# Patient Record
Sex: Male | Born: 1975 | Race: Black or African American | Hispanic: No | Marital: Married | Smoking: Former smoker
Health system: Southern US, Community
[De-identification: ages and names within clinical notes are randomized; demographics above are authoritative.]

## PROBLEM LIST (undated history)

## (undated) DIAGNOSIS — R569 Unspecified convulsions: Secondary | ICD-10-CM

## (undated) DIAGNOSIS — R079 Chest pain, unspecified: Secondary | ICD-10-CM

## (undated) DIAGNOSIS — I10 Essential (primary) hypertension: Secondary | ICD-10-CM

## (undated) DIAGNOSIS — R42 Dizziness and giddiness: Secondary | ICD-10-CM

## (undated) DIAGNOSIS — H02401 Unspecified ptosis of right eyelid: Secondary | ICD-10-CM

## (undated) DIAGNOSIS — G4700 Insomnia, unspecified: Secondary | ICD-10-CM

## (undated) HISTORY — DX: Chest pain, unspecified: R07.9

## (undated) HISTORY — DX: Insomnia, unspecified: G47.00

## (undated) HISTORY — PX: FOOT SURGERY: SHX648

## (undated) HISTORY — DX: Unspecified convulsions: R56.9

## (undated) HISTORY — DX: Unspecified ptosis of right eyelid: H02.401

## (undated) HISTORY — DX: Dizziness and giddiness: R42

---

## 2007-05-12 ENCOUNTER — Emergency Department: Payer: Self-pay | Admitting: Emergency Medicine

## 2012-10-09 ENCOUNTER — Ambulatory Visit: Payer: Self-pay | Admitting: Internal Medicine

## 2015-06-10 ENCOUNTER — Other Ambulatory Visit
Admission: RE | Admit: 2015-06-10 | Discharge: 2015-06-10 | Disposition: A | Discharge status: Home / Self Care | Payer: Managed Care, Other (non HMO) | Source: Ambulatory Visit | Attending: Nurse Practitioner | Admitting: Nurse Practitioner

## 2015-06-10 DIAGNOSIS — F524 Premature ejaculation: Secondary | ICD-10-CM | POA: Diagnosis present

## 2015-06-10 DIAGNOSIS — E782 Mixed hyperlipidemia: Secondary | ICD-10-CM | POA: Insufficient documentation

## 2015-06-10 DIAGNOSIS — Z0001 Encounter for general adult medical examination with abnormal findings: Secondary | ICD-10-CM | POA: Diagnosis present

## 2015-06-10 LAB — LIPID PANEL
CHOL/HDL RATIO: 2.8 ratio
CHOLESTEROL: 110 mg/dL (ref 0–200)
HDL: 39 mg/dL — AB (ref >40)
LDL CALC: 60 mg/dL (ref 0–99)
TRIGLYCERIDES: 56 mg/dL (ref <150)
VLDL: 11 mg/dL (ref 0–40)

## 2015-06-10 LAB — COMPREHENSIVE METABOLIC PANEL
ALT: 77 U/L — AB (ref 17–63)
ANION GAP: 3 — AB (ref 5–15)
AST: 101 U/L — ABNORMAL HIGH (ref 15–41)
Albumin: 4.1 g/dL (ref 3.5–5.0)
Alkaline Phosphatase: 47 U/L (ref 38–126)
BUN: 15 mg/dL (ref 6–20)
CHLORIDE: 106 mmol/L (ref 101–111)
CO2: 27 mmol/L (ref 22–32)
Calcium: 8.7 mg/dL — ABNORMAL LOW (ref 8.9–10.3)
Creatinine, Ser: 0.87 mg/dL (ref 0.61–1.24)
GFR calc non Af Amer: >60 mL/min (ref >60)
Glucose, Bld: 94 mg/dL (ref 65–99)
Potassium: 4.2 mmol/L (ref 3.5–5.1)
SODIUM: 136 mmol/L (ref 135–145)
Total Bilirubin: 0.6 mg/dL (ref 0.3–1.2)
Total Protein: 7 g/dL (ref 6.5–8.1)

## 2015-06-10 LAB — CBC WITH DIFFERENTIAL/PLATELET
BASOS ABS: 0 10*3/uL (ref 0–0.1)
BASOS PCT: 0 %
EOS PCT: 1 %
Eosinophils Absolute: 0.1 10*3/uL (ref 0–0.7)
HCT: 45 % (ref 40.0–52.0)
Hemoglobin: 15.1 g/dL (ref 13.0–18.0)
Lymphocytes Relative: 25 %
Lymphs Abs: 1.3 10*3/uL (ref 1.0–3.6)
MCH: 31.4 pg (ref 26.0–34.0)
MCHC: 33.6 g/dL (ref 32.0–36.0)
MCV: 93.6 fL (ref 80.0–100.0)
MONO ABS: 0.3 10*3/uL (ref 0.2–1.0)
MONOS PCT: 7 %
Neutro Abs: 3.5 10*3/uL (ref 1.4–6.5)
Neutrophils Relative %: 67 %
PLATELETS: 175 10*3/uL (ref 150–440)
RBC: 4.81 MIL/uL (ref 4.40–5.90)
RDW: 13.2 % (ref 11.5–14.5)
WBC: 5.2 10*3/uL (ref 3.8–10.6)

## 2015-06-10 LAB — T4, FREE: Free T4: 0.61 ng/dL (ref 0.61–1.12)

## 2015-06-10 LAB — TSH: TSH: 0.5 u[IU]/mL (ref 0.350–4.500)

## 2015-06-11 LAB — TESTOSTERONE: Testosterone: 563 ng/dL (ref 348–1197)

## 2016-01-31 ENCOUNTER — Encounter: Payer: Self-pay | Admitting: Emergency Medicine

## 2016-01-31 ENCOUNTER — Emergency Department
Admission: EM | Admit: 2016-01-31 | Discharge: 2016-01-31 | Disposition: A | Discharge status: Home / Self Care | Payer: Managed Care, Other (non HMO) | Attending: Student in an Organized Health Care Education/Training Program | Admitting: Student in an Organized Health Care Education/Training Program

## 2016-01-31 DIAGNOSIS — X503XXA Overexertion from repetitive movements, initial encounter: Secondary | ICD-10-CM | POA: Insufficient documentation

## 2016-01-31 DIAGNOSIS — S46011A Strain of muscle(s) and tendon(s) of the rotator cuff of right shoulder, initial encounter: Secondary | ICD-10-CM | POA: Insufficient documentation

## 2016-01-31 DIAGNOSIS — Y929 Unspecified place or not applicable: Secondary | ICD-10-CM | POA: Insufficient documentation

## 2016-01-31 DIAGNOSIS — Y9389 Activity, other specified: Secondary | ICD-10-CM | POA: Insufficient documentation

## 2016-01-31 DIAGNOSIS — F17290 Nicotine dependence, other tobacco product, uncomplicated: Secondary | ICD-10-CM | POA: Insufficient documentation

## 2016-01-31 DIAGNOSIS — Y990 Civilian activity done for income or pay: Secondary | ICD-10-CM | POA: Insufficient documentation

## 2016-01-31 MED ORDER — CYCLOBENZAPRINE HCL 10 MG PO TABS: 10.0000 mg | ORAL_TABLET | Freq: Three times a day (TID) | ORAL | 0 refills | Status: DC | PRN

## 2016-01-31 MED ORDER — MELOXICAM 15 MG PO TABS: 15.0000 mg | ORAL_TABLET | Freq: Every day | ORAL | 0 refills | Status: DC

## 2016-01-31 NOTE — ED Triage Notes (Signed)
R shoulder pain x 1 month, denies injury at onset.

## 2016-01-31 NOTE — ED Notes (Signed)
Pt verbalized understanding of discharge instructions. NAD at this time. 

## 2016-01-31 NOTE — ED Provider Notes (Signed)
[facility] Emergency Department Provider Note ____________________________________________  Time seen: Approximately 5:05 PM  I have reviewed the triage vital signs and the nursing notes.   HISTORY  Chief Complaint Shoulder Pain    HPI Tony Collier is a 40 y.o. male who presents to the emergency department for evaluation of right shoulder pain. Pain is present for the past month. He denies injury or history of shoulder pain. He reports a job where he makes repetitive motion with his right shoulder and feels this is the source of pain. He has intermittently taken ibuprofen and Aleve without relief.   History reviewed. No pertinent past medical history.  There are no active problems to display for this patient.   History reviewed. No pertinent surgical history.  Prior to Admission medications   Medication Sig Start Date End Date Taking? Authorizing Provider  cyclobenzaprine (FLEXERIL) 10 MG tablet Take 1 tablet (10 mg total) by mouth 3 (three) times daily as needed for muscle spasms. 01/31/16   Victorino Dike, FNP  meloxicam (MOBIC) 15 MG tablet Take 1 tablet (15 mg total) by mouth daily. 01/31/16   Victorino Dike, FNP    Allergies Patient has no known allergies.  No family history on file.  Social History Social History  Substance Use Topics  . Smoking status: Current Every Day Smoker  . Smokeless tobacco: Current User  . Alcohol use Not on file    Review of Systems Constitutional: No recent illness. Cardiovascular: Denies chest pain or palpitations. Respiratory: Denies shortness of breath. Musculoskeletal: Pain in Right shoulder Skin: Negative for rash, wound, lesion. Neurological: Negative for focal weakness or numbness.  ____________________________________________   PHYSICAL EXAM:  VITAL SIGNS: ED Triage Vitals  Enc Vitals Group     BP 01/31/16 1627 119/66     Pulse Rate 01/31/16 1627 60     Resp 01/31/16 1627 18     Temp  01/31/16 1627 98.1 F (36.7 C)     Temp Source 01/31/16 1627 Oral     SpO2 01/31/16 1627 98 %     Weight 01/31/16 1628 190 lb (86.2 kg)     Height 01/31/16 1628 5\' 6"  (1.676 m)     Head Circumference --      Peak Flow --      Pain Score 01/31/16 1629 8     Pain Loc --      Pain Edu? --      Excl. in [place]? --     Constitutional: Alert and oriented. Well appearing and in no acute distress. Eyes: Conjunctivae are normal. EOMI. Head: Atraumatic. Neck: No stridor.  Respiratory: Normal respiratory effort.   Musculoskeletal: Limited abduction and external rotation of the right shoulder secondary pain. Tenderness to palpation along the anterior and posterior joint lines. No step-off deformity or obvious AC separation. No tenderness along the clavicle. Neurologic:  Normal speech and language. No gross focal neurologic deficits are appreciated. Speech is normal. No gait instability. Skin:  Skin is warm, dry and intact. Atraumatic. Psychiatric: Mood and affect are normal. Speech and behavior are normal.  ____________________________________________   LABS (all labs ordered are listed, but only abnormal results are displayed)  Labs Reviewed - No data to display ____________________________________________  RADIOLOGY  Not indicated ____________________________________________   PROCEDURES  Procedure(s) performed: None   ____________________________________________   INITIAL IMPRESSION / ASSESSMENT AND PLAN / ED COURSE  Clinical Course     Pertinent labs & imaging results that were available during my  care of the patient were reviewed by me and considered in my medical decision making (see chart for details).  Patient was prescribed Flexeril and Naprosyn. He is to rest and ice the shoulder over the weekend. He was encouraged to follow up with orthopedics for symptoms that are not improving over the week. He was advised to return to emergency department for symptoms change or  worsening symptoms schedule an appointment. ____________________________________________   FINAL CLINICAL IMPRESSION(S) / ED DIAGNOSES  Final diagnoses:  Rotator cuff strain, right, initial encounter       Victorino Dike, FNP 01/31/16 1746    Merlyn Lot, MD 01/31/16 [phone]

## 2017-06-30 ENCOUNTER — Encounter: Payer: Self-pay | Admitting: Nurse Practitioner

## 2017-11-10 DIAGNOSIS — M779 Enthesopathy, unspecified: Secondary | ICD-10-CM | POA: Diagnosis not present

## 2017-11-10 DIAGNOSIS — M2021 Hallux rigidus, right foot: Secondary | ICD-10-CM | POA: Diagnosis not present

## 2017-11-10 DIAGNOSIS — M2022 Hallux rigidus, left foot: Secondary | ICD-10-CM | POA: Diagnosis not present

## 2017-11-10 DIAGNOSIS — M898X9 Other specified disorders of bone, unspecified site: Secondary | ICD-10-CM | POA: Diagnosis not present

## 2017-11-11 ENCOUNTER — Ambulatory Visit: Payer: BLUE CROSS/BLUE SHIELD | Admitting: Adult Health

## 2017-11-11 ENCOUNTER — Encounter: Payer: Self-pay | Admitting: Adult Health

## 2017-11-11 VITALS — BP 112/68 | HR 65 | Resp 16 | Ht 66.0 in | Wt 164.4 lb

## 2017-11-11 DIAGNOSIS — G40909 Epilepsy, unspecified, not intractable, without status epilepticus: Secondary | ICD-10-CM

## 2017-11-11 DIAGNOSIS — F17210 Nicotine dependence, cigarettes, uncomplicated: Secondary | ICD-10-CM | POA: Diagnosis not present

## 2017-11-11 DIAGNOSIS — G4700 Insomnia, unspecified: Secondary | ICD-10-CM

## 2017-11-11 DIAGNOSIS — Z720 Tobacco use: Secondary | ICD-10-CM | POA: Diagnosis not present

## 2017-11-11 MED ORDER — CARBAMAZEPINE 100 MG PO CHEW: 100.0000 mg | CHEWABLE_TABLET | ORAL | 3 refills | Status: DC

## 2017-11-11 MED ORDER — BUPROPION HCL ER (SR) 150 MG PO TB12
150.0000 mg | ORAL_TABLET | Freq: Two times a day (BID) | ORAL | 1 refills | Status: DC
Start: 2017-11-11 — End: 2017-12-22

## 2017-11-11 MED ORDER — NICOTINE 21 MG/24HR TD PT24: 21.0000 mg | MEDICATED_PATCH | Freq: Every day | TRANSDERMAL | 0 refills | Status: DC

## 2017-11-11 NOTE — Patient Instructions (Signed)
Coping with Quitting Smoking Quitting smoking is a physical and mental challenge. You will face cravings, withdrawal symptoms, and temptation. Before quitting, work with your health care provider to make a plan that can help you cope. Preparation can help you quit and keep you from giving in. How can I cope with cravings? Cravings usually last for 5-10 minutes. If you get through it, the craving will pass. Consider taking the following actions to help you cope with cravings:  Keep your mouth busy: ? Chew sugar-free gum. ? Suck on hard candies or a straw. ? Brush your teeth.  Keep your hands and body busy: ? Immediately change to a different activity when you feel a craving. ? Squeeze or play with a ball. ? Do an activity or a hobby, like making bead jewelry, practicing needlepoint, or working with wood. ? Mix up your normal routine. ? Take a short exercise break. Go for a quick walk or run up and down stairs. ? Spend time in public places where smoking is not allowed.  Focus on doing something kind or helpful for someone else.  Call a friend or family member to talk during a craving.  Join a support group.  Call a quit line, such as 1-800-QUIT-NOW.  Talk with your health care provider about medicines that might help you cope with cravings and make quitting easier for you.  How can I deal with withdrawal symptoms? Your body may experience negative effects as it tries to get used to not having nicotine in the system. These effects are called withdrawal symptoms. They may include:  Feeling hungrier than normal.  Trouble concentrating.  Irritability.  Trouble sleeping.  Feeling depressed.  Restlessness and agitation.  Craving a cigarette.  To manage withdrawal symptoms:  Avoid places, people, and activities that trigger your cravings.  Remember why you want to quit.  Get plenty of sleep.  Avoid coffee and other caffeinated drinks. These may worsen some of your  symptoms.  How can I handle social situations? Social situations can be difficult when you are quitting smoking, especially in the first few weeks. To manage this, you can:  Avoid parties, bars, and other social situations where people might be smoking.  Avoid alcohol.  Leave right away if you have the urge to smoke.  Explain to your family and friends that you are quitting smoking. Ask for understanding and support.  Plan activities with friends or family where smoking is not an option.  What are some ways I can cope with stress? Wanting to smoke may cause stress, and stress can make you want to smoke. Find ways to manage your stress. Relaxation techniques can help. For example:  Breathe slowly and deeply, in through your nose and out through your mouth.  Listen to soothing, relaxing music.  Talk with a family member or friend about your stress.  Light a candle.  Soak in a bath or take a shower.  Think about a peaceful place.  What are some ways I can prevent weight gain? Be aware that many people gain weight after they quit smoking. However, not everyone does. To keep from gaining weight, have a plan in place before you quit and stick to the plan after you quit. Your plan should include:  Having healthy snacks. When you have a craving, it may help to: ? Eat plain popcorn, crunchy carrots, celery, or other cut vegetables. ? Chew sugar-free gum.  Changing how you eat: ? Eat small portion sizes at meals. ?   Eat 4-6 small meals throughout the day instead of 1-2 large meals a day. ? Be mindful when you eat. Do not watch television or do other things that might distract you as you eat.  Exercising regularly: ? Make time to exercise each day. If you do not have time for a long workout, do short bouts of exercise for 5-10 minutes several times a day. ? Do some form of strengthening exercise, like weight lifting, and some form of aerobic exercise, like running or  swimming.  Drinking plenty of water or other low-calorie or no-calorie drinks. Drink 6-8 glasses of water daily, or as much as instructed by your health care provider.  Summary  Quitting smoking is a physical and mental challenge. You will face cravings, withdrawal symptoms, and temptation to smoke again. Preparation can help you as you go through these challenges.  You can cope with cravings by keeping your mouth busy (such as by chewing gum), keeping your body and hands busy, and making calls to family, friends, or a helpline for people who want to quit smoking.  You can cope with withdrawal symptoms by avoiding places where people smoke, avoiding drinks with caffeine, and getting plenty of rest.  Ask your health care provider about the different ways to prevent weight gain, avoid stress, and handle social situations. This information is not intended to replace advice given to you by your health care provider. Make sure you discuss any questions you have with your health care provider. Document Released: 02/27/2016 Document Revised: 02/27/2016 Document Reviewed: 02/27/2016 Elsevier Interactive Patient Education  2018 [employer]  

## 2017-11-11 NOTE — Progress Notes (Signed)
[facility] [place], [place] [address]  Internal MEDICINE  Office Visit Note  Patient Name: Tony Collier  [id number]  [id number]  Date of Service: 11/27/2017  Chief Complaint  Patient presents with  . Seizures  . Insomnia    HPI Pt here for follow up on seizures and insomnia. He is feeling well, denies complaints at this time.  He was a previous patient in this practice before. He is here to reestablish care. He would like to quit smoking, and is requesting Wellbutrin and nicotine patches.  He used this treatment to quit smoking before, and was successful for over a year before relapsing.          Current Medication: Outpatient Encounter Medications as of 11/11/2017  Medication Sig  . carbamazepine (TEGRETOL) 100 MG chewable tablet Chew 1 tablet (100 mg total) by mouth See admin instructions. Take 2 Tablets in AM, and 3 Tablets in PM.  . [DISCONTINUED] carbamazepine (TEGRETOL) 100 MG chewable tablet 2 TABS IN THE AM AND 3 TABS IN THE PM  . buPROPion (WELLBUTRIN SR) 150 MG 12 hr tablet Take 1 tablet (150 mg total) by mouth 2 (two) times daily.  Derrill Memo ON 12/11/2017] nicotine (NICODERM CQ - DOSED IN MG/24 HOURS) 21 mg/24hr patch Place 1 patch (21 mg total) onto the skin daily.  . [DISCONTINUED] cyclobenzaprine (FLEXERIL) 10 MG tablet Take 1 tablet (10 mg total) by mouth 3 (three) times daily as needed for muscle spasms. (Patient not taking: Reported on 11/11/2017)  . [DISCONTINUED] meloxicam (MOBIC) 15 MG tablet Take 1 tablet (15 mg total) by mouth daily. (Patient not taking: Reported on 11/11/2017)   No facility-administered encounter medications on file as of 11/11/2017.     Surgical History: History reviewed. No pertinent surgical history.  Medical History: Past Medical History:  Diagnosis Date  . Insomnia   . Seizures ([place])     Family History: Family History  Family history unknown: Yes    Social History   Socioeconomic History  . Marital  status: Married    Spouse name: Not on file  . Number of children: Not on file  . Years of education: Not on file  . Highest education level: Not on file  Occupational History  . Not on file  Social Needs  . Financial resource strain: Not on file  . Food insecurity:    Worry: Not on file    Inability: Not on file  . Transportation needs:    Medical: Not on file    Non-medical: Not on file  Tobacco Use  . Smoking status: Current Every Day Smoker  . Smokeless tobacco: Former Network engineer and Sexual Activity  . Alcohol use: Yes    Frequency: Never  . Drug use: Never  . Sexual activity: Not on file  Lifestyle  . Physical activity:    Days per week: Not on file    Minutes per session: Not on file  . Stress: Not on file  Relationships  . Social connections:    Talks on phone: Not on file    Gets together: Not on file    Attends religious service: Not on file    Active member of club or organization: Not on file    Attends meetings of clubs or organizations: Not on file    Relationship status: Not on file  . Intimate partner violence:    Fear of current or ex partner: Not on file    Emotionally abused:  Not on file    Physically abused: Not on file    Forced sexual activity: Not on file  Other Topics Concern  . Not on file  Social History Narrative  . Not on file   Review of Systems  Constitutional: Negative.  Negative for chills, fatigue and unexpected weight change.  HENT: Negative.  Negative for congestion, rhinorrhea, sneezing and sore throat.   Eyes: Negative for redness.  Respiratory: Negative.  Negative for cough, chest tightness and shortness of breath.   Cardiovascular: Negative.  Negative for chest pain and palpitations.  Gastrointestinal: Negative.  Negative for abdominal pain, constipation, diarrhea, nausea and vomiting.  Endocrine: Negative.   Genitourinary: Negative.  Negative for dysuria and frequency.  Musculoskeletal: Negative.  Negative for  arthralgias, back pain, joint swelling and neck pain.  Skin: Negative.  Negative for rash.  Allergic/Immunologic: Negative.   Neurological: Negative.  Negative for tremors and numbness.  Hematological: Negative for adenopathy. Does not bruise/bleed easily.  Psychiatric/Behavioral: Negative.  Negative for behavioral problems, sleep disturbance and suicidal ideas. The patient is not nervous/anxious.    Vital Signs: BP 112/68   Pulse 65   Resp 16   Ht 5\' 6"  (1.676 m)   Wt 164 lb 6.4 oz (74.6 kg)   SpO2 97%   BMI 26.53 kg/m   Physical Exam  Constitutional: He is oriented to person, place, and time. He appears well-developed and well-nourished. No distress.  HENT:  Head: Normocephalic and atraumatic.  Mouth/Throat: Oropharynx is clear and moist. No oropharyngeal exudate.  Eyes: Pupils are equal, round, and reactive to light. EOM are normal.  Neck: Normal range of motion. Neck supple. No JVD present. No tracheal deviation present. No thyromegaly present.  Cardiovascular: Normal rate, regular rhythm and normal heart sounds. Exam reveals no gallop and no friction rub.  No murmur heard. Pulmonary/Chest: Effort normal and breath sounds normal. No respiratory distress. He has no wheezes. He has no rales. He exhibits no tenderness.  Abdominal: Soft. There is no tenderness. There is no guarding.  Musculoskeletal: Normal range of motion.  Lymphadenopathy:    He has no cervical adenopathy.  Neurological: He is alert and oriented to person, place, and time. No cranial nerve deficit.  Skin: Skin is warm and dry. He is not diaphoretic.  Psychiatric: He has a normal mood and affect. His behavior is normal. Judgment and thought content normal.  Nursing note and vitals reviewed.  Assessment/Plan: 1. Seizure disorder ([place]) Continue taking medications as directed.  - carbamazepine (TEGRETOL) 100 MG chewable tablet; Chew 1 tablet (100 mg total) by mouth See admin instructions. Take 2 Tablets in AM,  and 3 Tablets in PM.  Dispense: 150 tablet; Refill: 3  2. Tobacco abuse Use Nicotine patches, and Wellbutrin as ordered and discussed for smoking cessation.  - buPROPion (WELLBUTRIN SR) 150 MG 12 hr tablet; Take 1 tablet (150 mg total) by mouth 2 (two) times daily.  Dispense: 30 tablet; Refill: 1 - nicotine (NICODERM CQ - DOSED IN MG/24 HOURS) 21 mg/24hr patch; Place 1 patch (21 mg total) onto the skin daily.  Dispense: 28 patch; Refill: 0  3. Insomnia, unspecified type Pt is doing well with this, He has been sleeping at least 7 hours nightly.   General Counseling: Casimiro Needle understanding of the findings of todays visit and agrees with plan of treatment. I have discussed any further diagnostic evaluation that may be needed or ordered today. We also reviewed his medications today. he has been encouraged to call  the office with any questions or concerns that should arise related to todays visit.  Meds ordered this encounter  Medications  . carbamazepine (TEGRETOL) 100 MG chewable tablet    Sig: Chew 1 tablet (100 mg total) by mouth See admin instructions. Take 2 Tablets in AM, and 3 Tablets in PM.    Dispense:  150 tablet    Refill:  3  . buPROPion (WELLBUTRIN SR) 150 MG 12 hr tablet    Sig: Take 1 tablet (150 mg total) by mouth 2 (two) times daily.    Dispense:  30 tablet    Refill:  1  . nicotine (NICODERM CQ - DOSED IN MG/24 HOURS) 21 mg/24hr patch    Sig: Place 1 patch (21 mg total) onto the skin daily.    Dispense:  28 patch    Refill:  0    Time spent: 25 Minutes  This patient was seen by Orson Gear AGNP-C in Collaboration with Dr Lavera Guise as a part of collaborative care agreement    Dr Lavera Guise Internal medicine

## 2017-11-23 ENCOUNTER — Other Ambulatory Visit: Payer: Self-pay | Admitting: Adult Health

## 2017-11-23 ENCOUNTER — Telehealth: Payer: Self-pay | Admitting: Adult Health

## 2017-11-23 MED ORDER — NICOTINE 21 MG/24HR TD PT24: 21.0000 mg | MEDICATED_PATCH | Freq: Every day | TRANSDERMAL | 0 refills | Status: DC

## 2017-11-24 ENCOUNTER — Other Ambulatory Visit: Payer: Self-pay | Admitting: Nurse Practitioner

## 2017-11-24 DIAGNOSIS — Z720 Tobacco use: Secondary | ICD-10-CM

## 2017-11-24 MED ORDER — VARENICLINE TARTRATE 1 MG PO TABS: ORAL_TABLET | ORAL | 3 refills | Status: DC

## 2017-11-24 MED ORDER — VARENICLINE TARTRATE 0.5 MG X 11 & 1 MG X 42 PO MISC: ORAL | 0 refills | Status: DC

## 2017-11-24 NOTE — Telephone Encounter (Signed)
Called and notified patient's wife.  

## 2017-11-24 NOTE — Progress Notes (Signed)
Sent in prescriptions for chantix tarting month and continuing month packs. Finish starting month before starting with continuing month. Prescription for continuing month has 3 additional refills. Both sent to [facility]

## 2017-12-09 ENCOUNTER — Ambulatory Visit: Payer: Self-pay | Admitting: Adult Health

## 2017-12-15 ENCOUNTER — Ambulatory Visit: Payer: Self-pay | Admitting: Adult Health

## 2017-12-22 ENCOUNTER — Encounter: Payer: Self-pay | Admitting: Adult Health

## 2017-12-22 ENCOUNTER — Ambulatory Visit: Payer: BLUE CROSS/BLUE SHIELD | Admitting: Adult Health

## 2017-12-22 VITALS — BP 114/78 | HR 63 | Resp 16 | Ht 66.0 in | Wt 169.2 lb

## 2017-12-22 DIAGNOSIS — Z720 Tobacco use: Secondary | ICD-10-CM

## 2017-12-22 DIAGNOSIS — G40909 Epilepsy, unspecified, not intractable, without status epilepticus: Secondary | ICD-10-CM

## 2017-12-22 MED ORDER — CARBAMAZEPINE 100 MG PO CHEW: 100.0000 mg | CHEWABLE_TABLET | ORAL | 3 refills | Status: DC

## 2017-12-22 MED ORDER — BUPROPION HCL ER (SR) 150 MG PO TB12: 150.0000 mg | ORAL_TABLET | Freq: Two times a day (BID) | ORAL | 1 refills | Status: DC

## 2017-12-22 MED ORDER — VARENICLINE TARTRATE 1 MG PO TABS: 1.0000 mg | ORAL_TABLET | Freq: Two times a day (BID) | ORAL | 0 refills | Status: DC

## 2017-12-22 NOTE — Patient Instructions (Signed)
Coping with Quitting Smoking Quitting smoking is a physical and mental challenge. You will face cravings, withdrawal symptoms, and temptation. Before quitting, work with your health care provider to make a plan that can help you cope. Preparation can help you quit and keep you from giving in. How can I cope with cravings? Cravings usually last for 5-10 minutes. If you get through it, the craving will pass. Consider taking the following actions to help you cope with cravings:  Keep your mouth busy: ? Chew sugar-free gum. ? Suck on hard candies or a straw. ? Brush your teeth.  Keep your hands and body busy: ? Immediately change to a different activity when you feel a craving. ? Squeeze or play with a ball. ? Do an activity or a hobby, like making bead jewelry, practicing needlepoint, or working with wood. ? Mix up your normal routine. ? Take a short exercise break. Go for a quick walk or run up and down stairs. ? Spend time in public places where smoking is not allowed.  Focus on doing something kind or helpful for someone else.  Call a friend or family member to talk during a craving.  Join a support group.  Call a quit line, such as 1-800-QUIT-NOW.  Talk with your health care provider about medicines that might help you cope with cravings and make quitting easier for you.  How can I deal with withdrawal symptoms? Your body may experience negative effects as it tries to get used to not having nicotine in the system. These effects are called withdrawal symptoms. They may include:  Feeling hungrier than normal.  Trouble concentrating.  Irritability.  Trouble sleeping.  Feeling depressed.  Restlessness and agitation.  Craving a cigarette.  To manage withdrawal symptoms:  Avoid places, people, and activities that trigger your cravings.  Remember why you want to quit.  Get plenty of sleep.  Avoid coffee and other caffeinated drinks. These may worsen some of your  symptoms.  How can I handle social situations? Social situations can be difficult when you are quitting smoking, especially in the first few weeks. To manage this, you can:  Avoid parties, bars, and other social situations where people might be smoking.  Avoid alcohol.  Leave right away if you have the urge to smoke.  Explain to your family and friends that you are quitting smoking. Ask for understanding and support.  Plan activities with friends or family where smoking is not an option.  What are some ways I can cope with stress? Wanting to smoke may cause stress, and stress can make you want to smoke. Find ways to manage your stress. Relaxation techniques can help. For example:  Breathe slowly and deeply, in through your nose and out through your mouth.  Listen to soothing, relaxing music.  Talk with a family member or friend about your stress.  Light a candle.  Soak in a bath or take a shower.  Think about a peaceful place.  What are some ways I can prevent weight gain? Be aware that many people gain weight after they quit smoking. However, not everyone does. To keep from gaining weight, have a plan in place before you quit and stick to the plan after you quit. Your plan should include:  Having healthy snacks. When you have a craving, it may help to: ? Eat plain popcorn, crunchy carrots, celery, or other cut vegetables. ? Chew sugar-free gum.  Changing how you eat: ? Eat small portion sizes at meals. ?   Eat 4-6 small meals throughout the day instead of 1-2 large meals a day. ? Be mindful when you eat. Do not watch television or do other things that might distract you as you eat.  Exercising regularly: ? Make time to exercise each day. If you do not have time for a long workout, do short bouts of exercise for 5-10 minutes several times a day. ? Do some form of strengthening exercise, like weight lifting, and some form of aerobic exercise, like running or  swimming.  Drinking plenty of water or other low-calorie or no-calorie drinks. Drink 6-8 glasses of water daily, or as much as instructed by your health care provider.  Summary  Quitting smoking is a physical and mental challenge. You will face cravings, withdrawal symptoms, and temptation to smoke again. Preparation can help you as you go through these challenges.  You can cope with cravings by keeping your mouth busy (such as by chewing gum), keeping your body and hands busy, and making calls to family, friends, or a helpline for people who want to quit smoking.  You can cope with withdrawal symptoms by avoiding places where people smoke, avoiding drinks with caffeine, and getting plenty of rest.  Ask your health care provider about the different ways to prevent weight gain, avoid stress, and handle social situations. This information is not intended to replace advice given to you by your health care provider. Make sure you discuss any questions you have with your health care provider. Document Released: 02/27/2016 Document Revised: 02/27/2016 Document Reviewed: 02/27/2016 Elsevier Interactive Patient Education  2018 [employer]  

## 2017-12-22 NOTE — Progress Notes (Signed)
[facility] [place], [place] [address]  Internal MEDICINE  Office Visit Note  Patient Name: Tony Collier  [id number]  [id number]  Date of Service: 12/22/2017  Chief Complaint  Patient presents with  . Seizures    follow up quit smoking     HPI Pt here for follow up on seizures and smoking cessation.  He reports his last seizure was multiple years ago.  He can not remember when he had his last seizure.  His tegretol level has not been checked recently. Orders will be sent at this visit.  He reports he continues to take chantix.  He has not smoked any cigarettes for 3-4 weeks at this point.  He still reports cravings and would like to continue to chantix at this time.     Current Medication: Outpatient Encounter Medications as of 12/22/2017  Medication Sig  . carbamazepine (TEGRETOL) 100 MG chewable tablet Chew 1 tablet (100 mg total) by mouth See admin instructions. Take 2 Tablets in AM, and 3 Tablets in PM.  . varenicline (CHANTIX CONTINUING MONTH PAK) 1 MG tablet Take by mouth as directed for smoking cessation.  . [DISCONTINUED] carbamazepine (TEGRETOL) 100 MG chewable tablet Chew 1 tablet (100 mg total) by mouth See admin instructions. Take 2 Tablets in AM, and 3 Tablets in PM.  . buPROPion (WELLBUTRIN SR) 150 MG 12 hr tablet Take 1 tablet (150 mg total) by mouth 2 (two) times daily.  . varenicline (CHANTIX CONTINUING MONTH PAK) 1 MG tablet Take 1 tablet (1 mg total) by mouth 2 (two) times daily.  . [DISCONTINUED] buPROPion (WELLBUTRIN SR) 150 MG 12 hr tablet Take 1 tablet (150 mg total) by mouth 2 (two) times daily. (Patient not taking: Reported on 12/22/2017)  . [DISCONTINUED] nicotine (NICODERM CQ - DOSED IN MG/24 HOURS) 21 mg/24hr patch Place 1 patch (21 mg total) onto the skin daily. (Patient not taking: Reported on 12/22/2017)  . [DISCONTINUED] nicotine (NICODERM CQ) 21 mg/24hr patch Place 1 patch (21 mg total) onto the skin daily. (Patient not  taking: Reported on 12/22/2017)  . [DISCONTINUED] varenicline (CHANTIX STARTING MONTH PAK) 0.5 MG X 11 & 1 MG X 42 tablet Take by mouth as directed (Patient not taking: Reported on 12/22/2017)   No facility-administered encounter medications on file as of 12/22/2017.     Surgical History: History reviewed. No pertinent surgical history.  Medical History: Past Medical History:  Diagnosis Date  . Insomnia   . Seizures ([place])     Family History: Family History  Family history unknown: Yes    Social History   Socioeconomic History  . Marital status: Married    Spouse name: Not on file  . Number of children: Not on file  . Years of education: Not on file  . Highest education level: Not on file  Occupational History  . Not on file  Social Needs  . Financial resource strain: Not on file  . Food insecurity:    Worry: Not on file    Inability: Not on file  . Transportation needs:    Medical: Not on file    Non-medical: Not on file  Tobacco Use  . Smoking status: Former Smoker    Last attempt to quit: 12/08/2017    Years since quitting: 0.0  . Smokeless tobacco: Former Network engineer and Sexual Activity  . Alcohol use: Yes    Frequency: Never  . Drug use: Never  . Sexual activity: Not on file  Lifestyle  .  Physical activity:    Days per week: Not on file    Minutes per session: Not on file  . Stress: Not on file  Relationships  . Social connections:    Talks on phone: Not on file    Gets together: Not on file    Attends religious service: Not on file    Active member of club or organization: Not on file    Attends meetings of clubs or organizations: Not on file    Relationship status: Not on file  . Intimate partner violence:    Fear of current or ex partner: Not on file    Emotionally abused: Not on file    Physically abused: Not on file    Forced sexual activity: Not on file  Other Topics Concern  . Not on file  Social History Narrative  . Not on file       Review of Systems  Constitutional: Negative.  Negative for chills, fatigue and unexpected weight change.  HENT: Negative.  Negative for congestion, rhinorrhea, sneezing and sore throat.   Eyes: Negative for redness.  Respiratory: Negative.  Negative for cough, chest tightness and shortness of breath.   Cardiovascular: Negative.  Negative for chest pain and palpitations.  Gastrointestinal: Negative.  Negative for abdominal pain, constipation, diarrhea, nausea and vomiting.  Endocrine: Negative.   Genitourinary: Negative.  Negative for dysuria and frequency.  Musculoskeletal: Negative.  Negative for arthralgias, back pain, joint swelling and neck pain.  Skin: Negative.  Negative for rash.  Allergic/Immunologic: Negative.   Neurological: Negative.  Negative for tremors and numbness.  Hematological: Negative for adenopathy. Does not bruise/bleed easily.  Psychiatric/Behavioral: Negative.  Negative for behavioral problems, sleep disturbance and suicidal ideas. The patient is not nervous/anxious.     Vital Signs: BP 114/78   Pulse 63   Resp 16   Ht 5\' 6"  (1.676 m)   Wt 169 lb 3.2 oz (76.7 kg)   SpO2 99%   BMI 27.31 kg/m    Physical Exam  Constitutional: He is oriented to person, place, and time. He appears well-developed and well-nourished. No distress.  HENT:  Head: Normocephalic and atraumatic.  Mouth/Throat: Oropharynx is clear and moist. No oropharyngeal exudate.  Eyes: Pupils are equal, round, and reactive to light. EOM are normal.  Neck: Normal range of motion. Neck supple. No JVD present. No tracheal deviation present. No thyromegaly present.  Cardiovascular: Normal rate, regular rhythm and normal heart sounds. Exam reveals no gallop and no friction rub.  No murmur heard. Pulmonary/Chest: Effort normal and breath sounds normal. No respiratory distress. He has no wheezes. He has no rales. He exhibits no tenderness.  Abdominal: Soft. There is no tenderness. There is no  guarding.  Musculoskeletal: Normal range of motion.  Lymphadenopathy:    He has no cervical adenopathy.  Neurological: He is alert and oriented to person, place, and time. No cranial nerve deficit.  Skin: Skin is warm and dry. He is not diaphoretic.  Psychiatric: He has a normal mood and affect. His behavior is normal. Judgment and thought content normal.  Nursing note and vitals reviewed.      Assessment/Plan: 1. Seizure disorder (HCC) Refilled Tegretol at this time.  Pt will go get Tegretol level, and we will adjust dose as indicated.  - Carbamazepine Level (Tegretol), total - carbamazepine (TEGRETOL) 100 MG chewable tablet; Chew 1 tablet (100 mg total) by mouth See admin instructions. Take 2 Tablets in AM, and 3 Tablets in PM.  Dispense: 150  tablet; Refill: 3  2. Tobacco abuse Continue using Chantix, and Wellbutrin.  - varenicline (CHANTIX CONTINUING MONTH PAK) 1 MG tablet; Take 1 tablet (1 mg total) by mouth 2 (two) times daily.  Dispense: 60 tablet; Refill: 0 - buPROPion (WELLBUTRIN SR) 150 MG 12 hr tablet; Take 1 tablet (150 mg total) by mouth 2 (two) times daily.  Dispense: 30 tablet; Refill: 1  Smoking cessation counseling: 1. Pt acknowledges the risks of long term smoking, she will try to quite smoking. 2. Options for different medications including nicotine products, chewing gum, patch etc, Wellbutrin and Chantix is discussed 3. Goal and date of compete cessation is discussed 4. Total time spent in smoking cessation is 15 min.  General Counseling: Casimiro Needle understanding of the findings of todays visit and agrees with plan of treatment. I have discussed any further diagnostic evaluation that may be needed or ordered today. We also reviewed his medications today. he has been encouraged to call the office with any questions or concerns that should arise related to todays visit.    Orders Placed This Encounter  Procedures  . Carbamazepine Level (Tegretol), total     Meds ordered this encounter  Medications  . varenicline (CHANTIX CONTINUING MONTH PAK) 1 MG tablet    Sig: Take 1 tablet (1 mg total) by mouth 2 (two) times daily.    Dispense:  60 tablet    Refill:  0  . buPROPion (WELLBUTRIN SR) 150 MG 12 hr tablet    Sig: Take 1 tablet (150 mg total) by mouth 2 (two) times daily.    Dispense:  30 tablet    Refill:  1  . carbamazepine (TEGRETOL) 100 MG chewable tablet    Sig: Chew 1 tablet (100 mg total) by mouth See admin instructions. Take 2 Tablets in AM, and 3 Tablets in PM.    Dispense:  150 tablet    Refill:  3    Time spent: 25 Minutes   This patient was seen by Orson Gear AGNP-C in Collaboration with Dr Lavera Guise as a part of collaborative care agreement    Orson Gear [facility] Internal medicine

## 2017-12-27 DIAGNOSIS — G40909 Epilepsy, unspecified, not intractable, without status epilepticus: Secondary | ICD-10-CM | POA: Diagnosis not present

## 2017-12-28 LAB — CARBAMAZEPINE LEVEL, TOTAL: Carbamazepine (Tegretol), S: 3.2 ug/mL — ABNORMAL LOW (ref 4.0–12.0)

## 2018-01-09 ENCOUNTER — Telehealth: Payer: Self-pay | Admitting: Adult Health

## 2018-01-09 NOTE — Telephone Encounter (Signed)
Left message returned patients call

## 2018-02-03 ENCOUNTER — Ambulatory Visit: Payer: Self-pay | Admitting: Adult Health

## 2018-02-06 ENCOUNTER — Ambulatory Visit: Payer: BLUE CROSS/BLUE SHIELD | Admitting: Adult Health

## 2018-02-06 ENCOUNTER — Encounter: Payer: Self-pay | Admitting: Adult Health

## 2018-02-06 VITALS — BP 102/72 | HR 72 | Resp 16 | Ht 66.0 in | Wt 177.0 lb

## 2018-02-06 DIAGNOSIS — G4700 Insomnia, unspecified: Secondary | ICD-10-CM

## 2018-02-06 DIAGNOSIS — G40909 Epilepsy, unspecified, not intractable, without status epilepticus: Secondary | ICD-10-CM

## 2018-02-06 DIAGNOSIS — Z720 Tobacco use: Secondary | ICD-10-CM

## 2018-02-06 MED ORDER — CARBAMAZEPINE 100 MG PO CHEW: CHEWABLE_TABLET | ORAL | 3 refills | Status: DC

## 2018-02-06 NOTE — Progress Notes (Signed)
[facility] [place], [place] [address]  Internal MEDICINE  Office Visit Note  Patient Name: Tony Collier  [id number]  [id number]  Date of Service: 02/06/2018  Chief Complaint  Patient presents with  . Seizures    follow up     HPI  Patient is here for follow-up on Tegretol level  for his seizure disorder.  His Tegretol level was found to be 3.2.  He is currently taking 200 mg of Tegretol in the morning and 300 mg of Tegretol at night.  He reports he has not had a seizure in multiple years, he is unsure of the last date.  He has been taking his current dose of Tegretol for quite some time for any difficulty.    Current Medication: Outpatient Encounter Medications as of 02/06/2018  Medication Sig  . buPROPion (WELLBUTRIN SR) 150 MG 12 hr tablet Take 1 tablet (150 mg total) by mouth 2 (two) times daily.  . carbamazepine (TEGRETOL) 100 MG chewable tablet Take 4 Tablets in AM and 4 Tablets in PM  . varenicline (CHANTIX CONTINUING MONTH PAK) 1 MG tablet Take by mouth as directed for smoking cessation.  . [DISCONTINUED] carbamazepine (TEGRETOL) 100 MG chewable tablet Chew 1 tablet (100 mg total) by mouth See admin instructions. Take 2 Tablets in AM, and 3 Tablets in PM.  . [DISCONTINUED] varenicline (CHANTIX CONTINUING MONTH PAK) 1 MG tablet Take 1 tablet (1 mg total) by mouth 2 (two) times daily.   No facility-administered encounter medications on file as of 02/06/2018.     Surgical History: History reviewed. No pertinent surgical history.  Medical History: Past Medical History:  Diagnosis Date  . Insomnia   . Seizures ([place])     Family History: Family History  Family history unknown: Yes    Social History   Socioeconomic History  . Marital status: Married    Spouse name: Not on file  . Number of children: Not on file  . Years of education: Not on file  . Highest education level: Not on file  Occupational History  . Not on file  Social  Needs  . Financial resource strain: Not on file  . Food insecurity:    Worry: Not on file    Inability: Not on file  . Transportation needs:    Medical: Not on file    Non-medical: Not on file  Tobacco Use  . Smoking status: Former Smoker    Last attempt to quit: 12/08/2017    Years since quitting: 0.1  . Smokeless tobacco: Former Network engineer and Sexual Activity  . Alcohol use: Yes    Frequency: Never  . Drug use: Never  . Sexual activity: Not on file  Lifestyle  . Physical activity:    Days per week: Not on file    Minutes per session: Not on file  . Stress: Not on file  Relationships  . Social connections:    Talks on phone: Not on file    Gets together: Not on file    Attends religious service: Not on file    Active member of club or organization: Not on file    Attends meetings of clubs or organizations: Not on file    Relationship status: Not on file  . Intimate partner violence:    Fear of current or ex partner: Not on file    Emotionally abused: Not on file    Physically abused: Not on file    Forced sexual activity:  Not on file  Other Topics Concern  . Not on file  Social History Narrative  . Not on file      Review of Systems  Constitutional: Negative.  Negative for chills, fatigue and unexpected weight change.  HENT: Negative.  Negative for congestion, rhinorrhea, sneezing and sore throat.   Eyes: Negative for redness.  Respiratory: Negative.  Negative for cough, chest tightness and shortness of breath.   Cardiovascular: Negative.  Negative for chest pain and palpitations.  Gastrointestinal: Negative.  Negative for abdominal pain, constipation, diarrhea, nausea and vomiting.  Endocrine: Negative.   Genitourinary: Negative.  Negative for dysuria and frequency.  Musculoskeletal: Negative.  Negative for arthralgias, back pain, joint swelling and neck pain.  Skin: Negative.  Negative for rash.  Allergic/Immunologic: Negative.   Neurological: Negative.   Negative for tremors and numbness.  Hematological: Negative for adenopathy. Does not bruise/bleed easily.  Psychiatric/Behavioral: Negative.  Negative for behavioral problems, sleep disturbance and suicidal ideas. The patient is not nervous/anxious.     Vital Signs: BP 102/72 (BP Location: Right Arm, Patient Position: Sitting, Cuff Size: Normal)   Pulse 72   Resp 16   Ht 5\' 6"  (1.676 m)   Wt 177 lb (80.3 kg)   SpO2 98%   BMI 28.57 kg/m    Physical Exam  Constitutional: He is oriented to person, place, and time. He appears well-developed and well-nourished. No distress.  HENT:  Head: Normocephalic and atraumatic.  Mouth/Throat: Oropharynx is clear and moist. No oropharyngeal exudate.  Eyes: Pupils are equal, round, and reactive to light. EOM are normal.  Neck: Normal range of motion. Neck supple. No JVD present. No tracheal deviation present. No thyromegaly present.  Cardiovascular: Normal rate, regular rhythm and normal heart sounds. Exam reveals no gallop and no friction rub.  No murmur heard. Pulmonary/Chest: Effort normal and breath sounds normal. No respiratory distress. He has no wheezes. He has no rales. He exhibits no tenderness.  Abdominal: Soft. There is no tenderness. There is no guarding.  Musculoskeletal: Normal range of motion.  Lymphadenopathy:    He has no cervical adenopathy.  Neurological: He is alert and oriented to person, place, and time. No cranial nerve deficit.  Skin: Skin is warm and dry. He is not diaphoretic.  Psychiatric: He has a normal mood and affect. His behavior is normal. Judgment and thought content normal.  Nursing note and vitals reviewed.  Assessment/Plan: 1. Seizure disorder [facility]) Patient will increase Tegretol this week to 300 mg in the morning and 300 g at night.  Next week he will increase to 400 mg in the morning and 400 mg at night.  3 weeks from now he will have his Tegretol level drawn again to see what his current level is. -  carbamazepine (TEGRETOL) 100 MG chewable tablet; Take 4 Tablets in AM and 4 Tablets in PM  Dispense: 240 tablet; Refill: 3  2. Tobacco abuse Patient continues to report that he has stopped smoking.  He states his been approximately 6-week. Smoking cessation counseling: 1. Pt acknowledges the risks of long term smoking, she will try to quite smoking. 2. Options for different medications including nicotine products, chewing gum, patch etc, Wellbutrin and Chantix is discussed 3. Goal and date of compete cessation is discussed 4. Total time spent in smoking cessation is 15 min.  3. Insomnia, unspecified type Patient denies any significant issues.  Continue current medications as prescribed.  General Counseling: Casimiro Needle understanding of the findings of todays visit and agrees  with plan of treatment. I have discussed any further diagnostic evaluation that may be needed or ordered today. We also reviewed his medications today. he has been encouraged to call the office with any questions or concerns that should arise related to todays visit.    No orders of the defined types were placed in this encounter.   Meds ordered this encounter  Medications  . carbamazepine (TEGRETOL) 100 MG chewable tablet    Sig: Take 4 Tablets in AM and 4 Tablets in PM    Dispense:  240 tablet    Refill:  3    Time spent: 25 Minutes   This patient was seen by Orson Gear AGNP-C in Collaboration with Dr Lavera Guise as a part of collaborative care agreement     Kendell Bane AGNP-C Internal medicine

## 2018-02-06 NOTE — Patient Instructions (Signed)
Seizure, Adult °When you have a seizure: °· Parts of your body may move. °· How aware or awake (conscious) you are may change. °· You may shake (convulse). ° °Some people have symptoms right before a seizure happens. These symptoms may include: °· Fear. °· Worry (anxiety). °· Feeling like you are going to throw up (nausea). °· Feeling like the room is spinning (vertigo). °· Feeling like you saw or heard something before (deja vu). °· Odd tastes or smells. °· Changes in vision, such as seeing flashing lights or spots. ° °Seizures usually last from 30 seconds to 2 minutes. Usually, they are not harmful unless they last a long time. °Follow these instructions at home: °Medicines °· Take over-the-counter and prescription medicines only as told by your doctor. °· Avoid anything that may keep your medicine from working, such as alcohol. °Activity °· Do not do any activities that would be dangerous if you had another seizure, like driving or swimming. Wait until your doctor approves. °· If you live in the [place] ask your local DMV (department of motor vehicles) when you can drive. °· Rest. °Teaching others °· Teach friends and family what to do when you have a seizure. They should: °? Lay you on the ground. °? Protect your head and body. °? Loosen any tight clothing around your neck. °? Turn you on your side. °? Stay with you until you are better. °? Not hold you down. °? Not put anything in your mouth. °? Know whether or not you need emergency care. °General instructions °· Contact your doctor each time you have a seizure. °· Avoid anything that gives you seizures. °· Keep a seizure diary. Write down: °? What you think caused each seizure. °? What you remember about each seizure. °· Keep all follow-up visits as told by your doctor. This is important. °Contact a doctor if: °· You have another seizure. °· You have seizures more often. °· There is any change in what happens during your seizures. °· You continue to have  seizures with treatment. °· You have symptoms of being sick or having an infection. °Get help right away if: °· You have a seizure: °? That lasts longer than 5 minutes. °? That is different than seizures you had before. °? That makes it harder to breathe. °? After you hurt your head. °· After a seizure, you cannot speak or use a part of your body. °· After a seizure, you are confused or have a bad headache. °· You have two or more seizures in a row. °· You are having seizures more often. °· You do not wake up right after a seizure. °· You get hurt during a seizure. °In an emergency: °· These symptoms may be an emergency. Do not wait to see if the symptoms will go away. Get medical help right away. Call your local emergency services (911 in the [place] Do not drive yourself to the hospital. °This information is not intended to replace advice given to you by your health care provider. Make sure you discuss any questions you have with your health care provider. °Document Released: 08/18/2007 Document Revised: 11/12/2015 Document Reviewed: 11/12/2015 °[employer]vier Interactive Patient Education © 2017 [employer] ° °

## 2018-02-17 ENCOUNTER — Other Ambulatory Visit: Payer: Self-pay | Admitting: Adult Health

## 2018-02-17 DIAGNOSIS — G40909 Epilepsy, unspecified, not intractable, without status epilepticus: Secondary | ICD-10-CM | POA: Diagnosis not present

## 2018-02-18 LAB — CARBAMAZEPINE LEVEL, TOTAL: CARBAMAZEPINE LVL: 8.3 ug/mL (ref 4.0–12.0)

## 2018-02-27 ENCOUNTER — Ambulatory Visit: Payer: Self-pay | Admitting: Adult Health

## 2018-03-09 ENCOUNTER — Other Ambulatory Visit: Payer: Self-pay

## 2018-03-09 DIAGNOSIS — G40909 Epilepsy, unspecified, not intractable, without status epilepticus: Secondary | ICD-10-CM

## 2018-03-09 DIAGNOSIS — Z720 Tobacco use: Secondary | ICD-10-CM

## 2018-03-09 MED ORDER — CARBAMAZEPINE 100 MG PO CHEW: CHEWABLE_TABLET | ORAL | 3 refills | Status: DC

## 2018-03-09 MED ORDER — BUPROPION HCL ER (SR) 150 MG PO TB12: 150.0000 mg | ORAL_TABLET | Freq: Two times a day (BID) | ORAL | 1 refills | Status: DC

## 2018-03-14 ENCOUNTER — Encounter: Payer: Self-pay | Admitting: Adult Health

## 2018-03-14 ENCOUNTER — Ambulatory Visit: Payer: BLUE CROSS/BLUE SHIELD | Admitting: Adult Health

## 2018-03-14 VITALS — BP 142/90 | HR 68 | Resp 16 | Ht 66.0 in | Wt 181.0 lb

## 2018-03-14 DIAGNOSIS — G4700 Insomnia, unspecified: Secondary | ICD-10-CM

## 2018-03-14 DIAGNOSIS — R030 Elevated blood-pressure reading, without diagnosis of hypertension: Secondary | ICD-10-CM | POA: Diagnosis not present

## 2018-03-14 DIAGNOSIS — G40909 Epilepsy, unspecified, not intractable, without status epilepticus: Secondary | ICD-10-CM | POA: Diagnosis not present

## 2018-03-14 DIAGNOSIS — F17219 Nicotine dependence, cigarettes, with unspecified nicotine-induced disorders: Secondary | ICD-10-CM

## 2018-03-14 NOTE — Progress Notes (Signed)
[facility] [place], [place] [address]  Internal MEDICINE  Office Visit Note  Patient Name: Tony Collier  [id number]  [id number]  Date of Service: 03/14/2018  Chief Complaint  Patient presents with  . Seizures  . Labs Only    HPI  Pt is here for follow up on seizures.  At last visit patient's Tegretol dose was increased and he recently had lab work drawn to check his level.  His level is currently therapeutic at 8.3.  Patient denies any symptoms he has not had any seizures.  He will continue to take this dose as prescribed.   Current Medication: Outpatient Encounter Medications as of 03/14/2018  Medication Sig  . buPROPion (WELLBUTRIN SR) 150 MG 12 hr tablet Take 1 tablet (150 mg total) by mouth 2 (two) times daily.  . carbamazepine (TEGRETOL) 100 MG chewable tablet Take 4 Tablets in AM and 4 Tablets in PM  . varenicline (CHANTIX CONTINUING MONTH PAK) 1 MG tablet Take by mouth as directed for smoking cessation.   No facility-administered encounter medications on file as of 03/14/2018.     Surgical History: History reviewed. No pertinent surgical history.  Medical History: Past Medical History:  Diagnosis Date  . Insomnia   . Seizures ([place])     Family History: Family History  Family history unknown: Yes    Social History   Socioeconomic History  . Marital status: Married    Spouse name: Not on file  . Number of children: Not on file  . Years of education: Not on file  . Highest education level: Not on file  Occupational History  . Not on file  Social Needs  . Financial resource strain: Not on file  . Food insecurity:    Worry: Not on file    Inability: Not on file  . Transportation needs:    Medical: Not on file    Non-medical: Not on file  Tobacco Use  . Smoking status: Former Smoker    Last attempt to quit: 12/08/2017    Years since quitting: 0.2  . Smokeless tobacco: Former Network engineer and Sexual Activity  .  Alcohol use: Yes    Frequency: Never    Comment: ocassional  . Drug use: Never  . Sexual activity: Not on file  Lifestyle  . Physical activity:    Days per week: Not on file    Minutes per session: Not on file  . Stress: Not on file  Relationships  . Social connections:    Talks on phone: Not on file    Gets together: Not on file    Attends religious service: Not on file    Active member of club or organization: Not on file    Attends meetings of clubs or organizations: Not on file    Relationship status: Not on file  . Intimate partner violence:    Fear of current or ex partner: Not on file    Emotionally abused: Not on file    Physically abused: Not on file    Forced sexual activity: Not on file  Other Topics Concern  . Not on file  Social History Narrative  . Not on file      Review of Systems  Constitutional: Negative.  Negative for chills, fatigue and unexpected weight change.  HENT: Negative.  Negative for congestion, rhinorrhea, sneezing and sore throat.   Eyes: Negative for redness.  Respiratory: Negative.  Negative for cough, chest tightness and shortness of  breath.   Cardiovascular: Negative.  Negative for chest pain and palpitations.  Gastrointestinal: Negative.  Negative for abdominal pain, constipation, diarrhea, nausea and vomiting.  Endocrine: Negative.   Genitourinary: Negative.  Negative for dysuria and frequency.  Musculoskeletal: Negative.  Negative for arthralgias, back pain, joint swelling and neck pain.  Skin: Negative.  Negative for rash.  Allergic/Immunologic: Negative.   Neurological: Negative.  Negative for tremors and numbness.  Hematological: Negative for adenopathy. Does not bruise/bleed easily.  Psychiatric/Behavioral: Negative.  Negative for behavioral problems, sleep disturbance and suicidal ideas. The patient is not nervous/anxious.     Vital Signs: BP (!) 142/90   Pulse 68   Resp 16   Ht 5\' 6"  (1.676 m)   Wt 181 lb (82.1 kg)    SpO2 99%   BMI 29.21 kg/m    Physical Exam Vitals signs and nursing note reviewed.  Constitutional:      General: He is not in acute distress.    Appearance: He is well-developed. He is not diaphoretic.  HENT:     Head: Normocephalic and atraumatic.     Mouth/Throat:     Pharynx: No oropharyngeal exudate.  Eyes:     Pupils: Pupils are equal, round, and reactive to light.  Neck:     Musculoskeletal: Normal range of motion and neck supple.     Thyroid: No thyromegaly.     Vascular: No JVD.     Trachea: No tracheal deviation.  Cardiovascular:     Rate and Rhythm: Normal rate and regular rhythm.     Heart sounds: Normal heart sounds. No murmur. No friction rub. No gallop.   Pulmonary:     Effort: Pulmonary effort is normal. No respiratory distress.     Breath sounds: Normal breath sounds. No wheezing or rales.  Chest:     Chest wall: No tenderness.  Abdominal:     Palpations: Abdomen is soft.     Tenderness: There is no abdominal tenderness. There is no guarding.  Musculoskeletal: Normal range of motion.  Lymphadenopathy:     Cervical: No cervical adenopathy.  Skin:    General: Skin is warm and dry.  Neurological:     Mental Status: He is alert and oriented to person, place, and time.     Cranial Nerves: No cranial nerve deficit.  Psychiatric:        Behavior: Behavior normal.        Thought Content: Thought content normal.        Judgment: Judgment normal.    Assessment/Plan: 1. Seizure disorder [facility]) Patient will continue to take Tegretol at current dose of 4 tabs twice a day.  He is instructed to let us know if any symptoms or side effects occur.  Also if he has a seizure he is told to return to clinic.  2. Insomnia, unspecified type Stable, patient denies any current issues.   3. Elevated blood pressure reading Patient has elevated blood pressure reading initially today's visit.  At recheck patient pressure was 142/88.  We will continue to monitor at future  visit.  4. Cigarette nicotine dependence with nicotine-induced disorder Once again discussed smoking cessation with patient.  He reports that he is currently smoking 6 cigarettes/day.  I have encouraged patient to drop down by 1 cigarette each week per day in hopes of assisting him with quitting.  He has tried Chantix as well as Wellbutrin in the past.  General Counseling: Syris verbalizes understanding of the findings of todays visit and agrees  with plan of treatment. I have discussed any further diagnostic evaluation that may be needed or ordered today. We also reviewed his medications today. he has been encouraged to call the office with any questions or concerns that should arise related to todays visit.    No orders of the defined types were placed in this encounter.   No orders of the defined types were placed in this encounter.   Time spent: 25 Minutes   This patient was seen by Orson Gear AGNP-C in Collaboration with Dr Lavera Guise as a part of collaborative care agreement     Kendell Bane AGNP-C Internal medicine

## 2018-03-14 NOTE — Patient Instructions (Signed)
Coping with Quitting Smoking  Quitting smoking is a physical and mental challenge. You will face cravings, withdrawal symptoms, and temptation. Before quitting, work with your health care provider to make a plan that can help you cope. Preparation can help you quit and keep you from giving in. How can I cope with cravings? Cravings usually last for 5-10 minutes. If you get through it, the craving will pass. Consider taking the following actions to help you cope with cravings:  Keep your mouth busy: ? Chew sugar-free gum. ? Suck on hard candies or a straw. ? Brush your teeth.  Keep your hands and body busy: ? Immediately change to a different activity when you feel a craving. ? Squeeze or play with a ball. ? Do an activity or a hobby, like making bead jewelry, practicing needlepoint, or working with wood. ? Mix up your normal routine. ? Take a short exercise break. Go for a quick walk or run up and down stairs. ? Spend time in public places where smoking is not allowed.  Focus on doing something kind or helpful for someone else.  Call a friend or family member to talk during a craving.  Join a support group.  Call a quit line, such as 1-800-QUIT-NOW.  Talk with your health care provider about medicines that might help you cope with cravings and make quitting easier for you. How can I deal with withdrawal symptoms? Your body may experience negative effects as it tries to get used to not having nicotine in the system. These effects are called withdrawal symptoms. They may include:  Feeling hungrier than normal.  Trouble concentrating.  Irritability.  Trouble sleeping.  Feeling depressed.  Restlessness and agitation.  Craving a cigarette. To manage withdrawal symptoms:  Avoid places, people, and activities that trigger your cravings.  Remember why you want to quit.  Get plenty of sleep.  Avoid coffee and other caffeinated drinks. These may worsen some of your symptoms.  How can I handle social situations? Social situations can be difficult when you are quitting smoking, especially in the first few weeks. To manage this, you can:  Avoid parties, bars, and other social situations where people might be smoking.  Avoid alcohol.  Leave right away if you have the urge to smoke.  Explain to your family and friends that you are quitting smoking. Ask for understanding and support.  Plan activities with friends or family where smoking is not an option. What are some ways I can cope with stress? Wanting to smoke may cause stress, and stress can make you want to smoke. Find ways to manage your stress. Relaxation techniques can help. For example:  Breathe slowly and deeply, in through your nose and out through your mouth.  Listen to soothing, relaxing music.  Talk with a family member or friend about your stress.  Light a candle.  Soak in a bath or take a shower.  Think about a peaceful place. What are some ways I can prevent weight gain? Be aware that many people gain weight after they quit smoking. However, not everyone does. To keep from gaining weight, have a plan in place before you quit and stick to the plan after you quit. Your plan should include:  Having healthy snacks. When you have a craving, it may help to: ? Eat plain popcorn, crunchy carrots, celery, or other cut vegetables. ? Chew sugar-free gum.  Changing how you eat: ? Eat small portion sizes at meals. ? Eat 4-6 small meals   throughout the day instead of 1-2 large meals a day. ? Be mindful when you eat. Do not watch television or do other things that might distract you as you eat.  Exercising regularly: ? Make time to exercise each day. If you do not have time for a long workout, do short bouts of exercise for 5-10 minutes several times a day. ? Do some form of strengthening exercise, like weight lifting, and some form of aerobic exercise, like running or swimming.  Drinking plenty of  water or other low-calorie or no-calorie drinks. Drink 6-8 glasses of water daily, or as much as instructed by your health care provider. Summary  Quitting smoking is a physical and mental challenge. You will face cravings, withdrawal symptoms, and temptation to smoke again. Preparation can help you as you go through these challenges.  You can cope with cravings by keeping your mouth busy (such as by chewing gum), keeping your body and hands busy, and making calls to family, friends, or a helpline for people who want to quit smoking.  You can cope with withdrawal symptoms by avoiding places where people smoke, avoiding drinks with caffeine, and getting plenty of rest.  Ask your health care provider about the different ways to prevent weight gain, avoid stress, and handle social situations. This information is not intended to replace advice given to you by your health care provider. Make sure you discuss any questions you have with your health care provider. Document Released: 02/27/2016 Document Revised: 02/27/2016 Document Reviewed: 02/27/2016 Elsevier Interactive Patient Education  2019 Elsevier Inc.  

## 2018-08-21 ENCOUNTER — Other Ambulatory Visit: Payer: Self-pay

## 2018-08-21 DIAGNOSIS — G40909 Epilepsy, unspecified, not intractable, without status epilepticus: Secondary | ICD-10-CM

## 2018-08-21 DIAGNOSIS — Z720 Tobacco use: Secondary | ICD-10-CM

## 2018-08-21 MED ORDER — BUPROPION HCL ER (SR) 150 MG PO TB12: 150.0000 mg | ORAL_TABLET | Freq: Two times a day (BID) | ORAL | 0 refills | Status: DC

## 2018-08-21 MED ORDER — CARBAMAZEPINE 100 MG PO CHEW: CHEWABLE_TABLET | ORAL | 0 refills | Status: DC

## 2018-08-23 ENCOUNTER — Other Ambulatory Visit: Payer: Self-pay

## 2018-09-11 ENCOUNTER — Encounter: Payer: Self-pay | Admitting: Internal Medicine

## 2018-09-11 ENCOUNTER — Ambulatory Visit: Payer: Self-pay | Admitting: Internal Medicine

## 2018-09-11 ENCOUNTER — Other Ambulatory Visit: Payer: Self-pay

## 2018-09-11 DIAGNOSIS — R413 Other amnesia: Secondary | ICD-10-CM

## 2018-09-11 DIAGNOSIS — F17219 Nicotine dependence, cigarettes, with unspecified nicotine-induced disorders: Secondary | ICD-10-CM

## 2018-09-11 DIAGNOSIS — G40909 Epilepsy, unspecified, not intractable, without status epilepticus: Secondary | ICD-10-CM

## 2018-09-11 NOTE — Progress Notes (Signed)
[facility] [place], [place] [address]  Internal MEDICINE  Office Visit Note  Patient Name: Tony Collier  [id number]  [id number]  Date of Service: 09/11/2018  Chief Complaint  Patient presents with  . Medical Management of Chronic Issues    memory loss   . Seizures    HPI  C/O having episodes of memory loss. He is not sure if he is having a sz, pt is non adherent with his intake of med's, last few levels of carbamazepine have been low. Diagnosis of Seizure disorder/epilepsy since childhood. Does not see a neurologist. Pt is not taking Chantix or Wellbutrin   Current Medication: Outpatient Encounter Medications as of 09/11/2018  Medication Sig  . carbamazepine (TEGRETOL) 100 MG chewable tablet Take 4 Tablets in AM and 4 Tablets in PM  . [DISCONTINUED] buPROPion (WELLBUTRIN SR) 150 MG 12 hr tablet Take 1 tablet (150 mg total) by mouth 2 (two) times daily.  . [DISCONTINUED] varenicline (CHANTIX CONTINUING MONTH PAK) 1 MG tablet Take by mouth as directed for smoking cessation.   No facility-administered encounter medications on file as of 09/11/2018.     Surgical History: History reviewed. No pertinent surgical history.  Medical History: Past Medical History:  Diagnosis Date  . Insomnia   . Seizures ([place])     Family History: Family History  Family history unknown: Yes    Social History   Socioeconomic History  . Marital status: Married    Spouse name: Not on file  . Number of children: Not on file  . Years of education: Not on file  . Highest education level: Not on file  Occupational History  . Not on file  Social Needs  . Financial resource strain: Not on file  . Food insecurity    Worry: Not on file    Inability: Not on file  . Transportation needs    Medical: Not on file    Non-medical: Not on file  Tobacco Use  . Smoking status: Former Smoker    Quit date: 12/08/2017    Years since quitting: 0.7  . Smokeless tobacco:  Former Network engineer and Sexual Activity  . Alcohol use: Yes    Frequency: Never    Comment: ocassional  . Drug use: Never  . Sexual activity: Not on file  Lifestyle  . Physical activity    Days per week: Not on file    Minutes per session: Not on file  . Stress: Not on file  Relationships  . Social Herbalist on phone: Not on file    Gets together: Not on file    Attends religious service: Not on file    Active member of club or organization: Not on file    Attends meetings of clubs or organizations: Not on file    Relationship status: Not on file  . Intimate partner violence    Fear of current or ex partner: Not on file    Emotionally abused: Not on file    Physically abused: Not on file    Forced sexual activity: Not on file  Other Topics Concern  . Not on file  Social History Narrative  . Not on file    Review of Systems  Constitutional: Negative for chills, fatigue and unexpected weight change.  HENT: Positive for postnasal drip. Negative for congestion, rhinorrhea, sneezing and sore throat.   Eyes: Negative for redness.  Respiratory: Negative for cough, chest tightness and shortness of breath.  Cardiovascular: Negative for chest pain and palpitations.  Gastrointestinal: Negative for abdominal pain, constipation, diarrhea, nausea and vomiting.  Genitourinary: Negative for dysuria and frequency.  Musculoskeletal: Negative for arthralgias, back pain, joint swelling and neck pain.  Skin: Negative for rash.  Neurological: Negative.  Negative for tremors and numbness.  Hematological: Negative for adenopathy. Does not bruise/bleed easily.  Psychiatric/Behavioral: Negative for behavioral problems (Depression), sleep disturbance and suicidal ideas. The patient is not nervous/anxious.     Vital Signs: BP 120/80   Pulse 74   Resp 16   Ht 5\' 6"  (1.676 m)   Wt 204 lb (92.5 kg)   SpO2 94% Comment: room air  BMI 32.93 kg/m    Physical Exam Constitutional:       General: He is not in acute distress.    Appearance: He is well-developed. He is not diaphoretic.  HENT:     Head: Normocephalic and atraumatic.     Mouth/Throat:     Pharynx: No oropharyngeal exudate.  Eyes:     Pupils: Pupils are equal, round, and reactive to light.  Neck:     Musculoskeletal: Normal range of motion and neck supple.     Thyroid: No thyromegaly.     Vascular: No JVD.     Trachea: No tracheal deviation.  Cardiovascular:     Rate and Rhythm: Normal rate and regular rhythm.     Heart sounds: Normal heart sounds. No murmur. No friction rub. No gallop.   Pulmonary:     Effort: Pulmonary effort is normal. No respiratory distress.     Breath sounds: No wheezing or rales.  Chest:     Chest wall: No tenderness.  Abdominal:     General: Bowel sounds are normal.     Palpations: Abdomen is soft.  Musculoskeletal: Normal range of motion.  Lymphadenopathy:     Cervical: No cervical adenopathy.  Skin:    General: Skin is warm and dry.  Neurological:     Mental Status: He is alert and oriented to person, place, and time.     Cranial Nerves: No cranial nerve deficit.  Psychiatric:        Behavior: Behavior normal.        Thought Content: Thought content normal.        Judgment: Judgment normal.    Assessment/Plan: 1. Seizure disorder ([place]) Pt is encouraged/stressed  to take his carbamazepine regularly   2. Cigarette nicotine dependence with nicotine-induced disorder Encouraged smoking cessation   3. Memory change Multifactorial, uncontrolled sz, or petit mal, might need EEG, or sleep study, Lab ordered for B12 , TSH and Carbamazepine level, hand written [facility] [employer] sheet is given to the pt   General Counseling: Tony Needle understanding of the findings of todays visit and agrees with plan of treatment. I have discussed any further diagnostic evaluation that may be needed or ordered today. We also reviewed his medications today. he has been encouraged to call  the office with any questions or concerns that should arise related to todays visit.   Time spent:20 Minute  Dr Lavera Guise Internal medicine

## 2018-09-28 ENCOUNTER — Other Ambulatory Visit: Payer: Self-pay

## 2018-09-28 ENCOUNTER — Encounter: Payer: Self-pay | Admitting: Adult Health

## 2018-09-28 ENCOUNTER — Ambulatory Visit (INDEPENDENT_AMBULATORY_CARE_PROVIDER_SITE_OTHER): Payer: Self-pay | Admitting: Adult Health

## 2018-09-28 VITALS — BP 134/87 | HR 66 | Temp 98.0°F | Resp 16 | Ht 66.0 in | Wt 207.0 lb

## 2018-09-28 DIAGNOSIS — R413 Other amnesia: Secondary | ICD-10-CM

## 2018-09-28 DIAGNOSIS — G40909 Epilepsy, unspecified, not intractable, without status epilepticus: Secondary | ICD-10-CM

## 2018-09-28 DIAGNOSIS — Z720 Tobacco use: Secondary | ICD-10-CM

## 2018-09-28 NOTE — Progress Notes (Signed)
[facility] [place], [place] [address]  Internal MEDICINE  Office Visit Note  Patient Name: Tony Collier  [id number]  [id number]  Date of Service: 09/28/2018  Chief Complaint  Patient presents with  . Medical Management of Chronic Issues    having issues with possible attention deficit issues, lost job opportunities due to not being able to complete orientation testing      HPI Pt is here for a sick visit. Pt has returned to office today complaining of memory issues.  He also reports having issues with his attention and concentration.  He reports he has even lost job opportunities due to not being able to complete the testing during orientation. He describes this a memory issues, however it sounds like his reading comprehension is also diminished.  He was last seen in our office on 09/11/2018 by Dr. Stephanie Coup for similar complaint.  He was given lab orders for B12, TSH, and Carbamazepine level that he has not had drawn yet. He can not recall when his last seizure was.  He states that a few weeks ago he woke up dizzy, and is unsure if he had a seizure while he was sleeping.       Current Medication:  Outpatient Encounter Medications as of 09/28/2018  Medication Sig  . carbamazepine (TEGRETOL) 100 MG chewable tablet Take 4 Tablets in AM and 4 Tablets in PM   No facility-administered encounter medications on file as of 09/28/2018.       Medical History: Past Medical History:  Diagnosis Date  . Insomnia   . Seizures ([facility])      Vital Signs: BP 134/87   Pulse 66   Temp 98 F (36.7 C)   Resp 16   Ht 5\' 6"  (1.676 m)   Wt 207 lb (93.9 kg)   SpO2 97%   BMI 33.41 kg/m    Review of Systems  Constitutional: Negative.  Negative for chills, fatigue and unexpected weight change.  HENT: Negative.  Negative for congestion, rhinorrhea, sneezing and sore throat.   Eyes: Negative for redness.  Respiratory: Negative.  Negative for cough, chest tightness  and shortness of breath.   Cardiovascular: Negative.  Negative for chest pain and palpitations.  Gastrointestinal: Negative.  Negative for abdominal pain, constipation, diarrhea, nausea and vomiting.  Endocrine: Negative.   Genitourinary: Negative.  Negative for dysuria and frequency.  Musculoskeletal: Negative.  Negative for arthralgias, back pain, joint swelling and neck pain.  Skin: Negative.  Negative for rash.  Allergic/Immunologic: Negative.   Neurological: Negative.  Negative for tremors and numbness.  Hematological: Negative for adenopathy. Does not bruise/bleed easily.  Psychiatric/Behavioral: Negative.  Negative for behavioral problems, sleep disturbance and suicidal ideas. The patient is not nervous/anxious.     Physical Exam Vitals signs and nursing note reviewed.  Constitutional:      General: He is not in acute distress.    Appearance: He is well-developed. He is not diaphoretic.  HENT:     Head: Normocephalic and atraumatic.     Mouth/Throat:     Pharynx: No oropharyngeal exudate.  Eyes:     Pupils: Pupils are equal, round, and reactive to light.  Neck:     Musculoskeletal: Normal range of motion and neck supple.     Thyroid: No thyromegaly.     Vascular: No JVD.     Trachea: No tracheal deviation.  Cardiovascular:     Rate and Rhythm: Normal rate and regular rhythm.  Heart sounds: Normal heart sounds. No murmur. No friction rub. No gallop.   Pulmonary:     Effort: Pulmonary effort is normal. No respiratory distress.     Breath sounds: Normal breath sounds. No wheezing or rales.  Chest:     Chest wall: No tenderness.  Abdominal:     Palpations: Abdomen is soft.     Tenderness: There is no abdominal tenderness. There is no guarding.  Musculoskeletal: Normal range of motion.  Lymphadenopathy:     Cervical: No cervical adenopathy.  Skin:    General: Skin is warm and dry.  Neurological:     Mental Status: He is alert and oriented to person, place, and time.      Cranial Nerves: No cranial nerve deficit.  Psychiatric:        Behavior: Behavior normal.        Thought Content: Thought content normal.        Judgment: Judgment normal.    Assessment/Plan: 1. Seizure disorder [facility]) Due to multifactorial symptoms, pt should reestablish care with neurology.  Will likely need EEG.   - Ambulatory referral to Neurology  2. Memory change Pt describes memory deficit and trouble with comprehension.   - Ambulatory referral to Neurology  3. Tobacco abuse Pt reports he quit smoking about 6 months ago. Continued to encourage him to not smoke.  Smoking cessation counseling: 1. Pt acknowledges the risks of long term smoking, she will try to quite smoking. 2. Options for different medications including nicotine products, chewing gum, patch etc, Wellbutrin and Chantix is discussed 3. Goal and date of compete cessation is discussed 4. Total time spent in smoking cessation is 15 min.   General Counseling: Casimiro Needle understanding of the findings of todays visit and agrees with plan of treatment. I have discussed any further diagnostic evaluation that may be needed or ordered today. We also reviewed his medications today. he has been encouraged to call the office with any questions or concerns that should arise related to todays visit.   No orders of the defined types were placed in this encounter.   No orders of the defined types were placed in this encounter.   Time spent: 20 Minutes  This patient was seen by Orson Gear AGNP-C in Collaboration with Dr Lavera Guise as a part of collaborative care agreement.  Kendell Bane AGNP-C Internal Medicine

## 2018-10-11 ENCOUNTER — Encounter: Payer: Self-pay | Admitting: Internal Medicine

## 2018-10-17 ENCOUNTER — Other Ambulatory Visit: Payer: Self-pay

## 2018-10-17 ENCOUNTER — Encounter: Payer: Self-pay | Admitting: Adult Health

## 2018-10-17 ENCOUNTER — Ambulatory Visit: Payer: Self-pay | Admitting: Adult Health

## 2018-10-17 VITALS — BP 133/83 | HR 85 | Temp 97.1°F | Resp 16 | Ht 67.0 in | Wt 207.0 lb

## 2018-10-17 DIAGNOSIS — G40909 Epilepsy, unspecified, not intractable, without status epilepticus: Secondary | ICD-10-CM

## 2018-10-17 DIAGNOSIS — L089 Local infection of the skin and subcutaneous tissue, unspecified: Secondary | ICD-10-CM

## 2018-10-17 DIAGNOSIS — B9689 Other specified bacterial agents as the cause of diseases classified elsewhere: Secondary | ICD-10-CM

## 2018-10-17 MED ORDER — DOXYCYCLINE HYCLATE 100 MG PO TABS: 100.0000 mg | ORAL_TABLET | Freq: Two times a day (BID) | ORAL | 0 refills | Status: DC

## 2018-10-17 MED ORDER — TRAMADOL HCL 50 MG PO TABS: 50.0000 mg | ORAL_TABLET | Freq: Every day | ORAL | 0 refills | Status: AC

## 2018-10-17 NOTE — Progress Notes (Signed)
[facility] [place], [place] [address]  Internal MEDICINE  Office Visit Note  Patient Name: Tony Collier  [id number]  [id number]  Date of Service: 10/17/2018  Chief Complaint  Patient presents with  . Cyst    under the left armpit started a couple of weeks ago, getting worse      HPI Pt is here for a sick visit. Pt reports about a week ago, he noticed a cyst-like area in his left axilla. It is painful and red. He denies trying anything at home to resolve it.  He is here because now, he has multiple sites in the axilla that are swollen, red and painful     Current Medication:  Outpatient Encounter Medications as of 10/17/2018  Medication Sig  . carbamazepine (TEGRETOL) 100 MG chewable tablet Take 4 Tablets in AM and 4 Tablets in PM   No facility-administered encounter medications on file as of 10/17/2018.       Medical History: Past Medical History:  Diagnosis Date  . Insomnia   . Seizures (HCC)      Vital Signs: BP 133/83   Pulse 85   Temp (!) 97.1 F (36.2 C)   Resp 16   Ht 5\' 7"  (1.702 m)   Wt 207 lb (93.9 kg)   SpO2 96%   BMI 32.42 kg/m    Review of Systems  Constitutional: Negative.  Negative for chills, fatigue and unexpected weight change.  HENT: Negative.  Negative for congestion, rhinorrhea, sneezing and sore throat.   Eyes: Negative for redness.  Respiratory: Negative.  Negative for cough, chest tightness and shortness of breath.   Cardiovascular: Negative.  Negative for chest pain and palpitations.  Gastrointestinal: Negative.  Negative for abdominal pain, constipation, diarrhea, nausea and vomiting.  Endocrine: Negative.   Genitourinary: Negative.  Negative for dysuria and frequency.  Musculoskeletal: Negative.  Negative for arthralgias, back pain, joint swelling and neck pain.  Skin: Negative.  Negative for rash.  Allergic/Immunologic: Negative.   Neurological: Negative.  Negative for tremors and numbness.   Hematological: Negative for adenopathy. Does not bruise/bleed easily.  Psychiatric/Behavioral: Negative.  Negative for behavioral problems, sleep disturbance and suicidal ideas. The patient is not nervous/anxious.     Physical Exam Vitals signs and nursing note reviewed.  Constitutional:      General: He is not in acute distress.    Appearance: He is well-developed. He is not diaphoretic.  HENT:     Head: Normocephalic and atraumatic.     Mouth/Throat:     Pharynx: No oropharyngeal exudate.  Eyes:     Pupils: Pupils are equal, round, and reactive to light.  Neck:     Musculoskeletal: Normal range of motion and neck supple.     Thyroid: No thyromegaly.     Vascular: No JVD.     Trachea: No tracheal deviation.  Cardiovascular:     Rate and Rhythm: Normal rate and regular rhythm.     Heart sounds: Normal heart sounds. No murmur. No friction rub. No gallop.   Pulmonary:     Effort: Pulmonary effort is normal. No respiratory distress.     Breath sounds: Normal breath sounds. No wheezing or rales.  Chest:     Chest wall: No tenderness.  Abdominal:     Palpations: Abdomen is soft.     Tenderness: There is no abdominal tenderness. There is no guarding.  Musculoskeletal: Normal range of motion.  Lymphadenopathy:     Cervical: No cervical adenopathy.  Skin:    General: Skin is warm and dry.     Comments: Left axilla cysts.  Neurological:     Mental Status: He is alert and oriented to person, place, and time.     Cranial Nerves: No cranial nerve deficit.  Psychiatric:        Behavior: Behavior normal.        Thought Content: Thought content normal.        Judgment: Judgment normal.    Assessment/Plan: 1. Skin infection, bacterial Advised patient to take entire course of antibiotics as prescribed with food. Pt should return to clinic in 7-10 days if symptoms fail to improve or new symptoms develop.  - doxycycline (VIBRA-TABS) 100 MG tablet; Take 1 tablet (100 mg total) by mouth  2 (two) times daily.  Dispense: 28 tablet; Refill: 0 - traMADol (ULTRAM) 50 MG tablet; Take 1 tablet (50 mg total) by mouth at bedtime for 5 doses.  Dispense: 5 tablet; Refill: 0 Reviewed risks and possible side effects associated with taking opiates, benzodiazepines and other CNS depressants. Combination of these could cause dizziness and drowsiness. Advised patient not to drive or operate machinery when taking these medications, as patient's and other's life can be at risk and will have consequences. Patient verbalized understanding in this matter. Dependence and abuse for these drugs will be monitored closely. A Controlled substance policy and procedure is on file which allows [facility] to order a urine drug screen test at any visit. Patient understands and agrees with the plan  2. Seizure disorder ([place]) Stable, continue present therapy.   General Counseling: Casimiro Needle understanding of the findings of todays visit and agrees with plan of treatment. I have discussed any further diagnostic evaluation that may be needed or ordered today. We also reviewed his medications today. he has been encouraged to call the office with any questions or concerns that should arise related to todays visit.   No orders of the defined types were placed in this encounter.   No orders of the defined types were placed in this encounter.   Time spent: 15 Minutes  This patient was seen by Orson Gear AGNP-C in Collaboration with Dr Lavera Guise as a part of collaborative care agreement.  Kendell Bane AGNP-C Internal Medicine

## 2018-11-09 ENCOUNTER — Telehealth: Payer: Self-pay

## 2018-11-09 NOTE — Telephone Encounter (Signed)
Mailed patient medical records to [facility] from 03/15/2017-Present. Beth

## 2018-12-12 ENCOUNTER — Other Ambulatory Visit: Payer: Self-pay

## 2018-12-12 ENCOUNTER — Ambulatory Visit: Payer: Self-pay | Admitting: Adult Health

## 2018-12-12 ENCOUNTER — Encounter: Payer: Self-pay | Admitting: Adult Health

## 2018-12-12 VITALS — BP 118/88 | HR 74 | Resp 16 | Ht 66.0 in | Wt 210.0 lb

## 2018-12-12 DIAGNOSIS — L089 Local infection of the skin and subcutaneous tissue, unspecified: Secondary | ICD-10-CM

## 2018-12-12 DIAGNOSIS — G40909 Epilepsy, unspecified, not intractable, without status epilepticus: Secondary | ICD-10-CM

## 2018-12-12 DIAGNOSIS — B9689 Other specified bacterial agents as the cause of diseases classified elsewhere: Secondary | ICD-10-CM

## 2018-12-12 DIAGNOSIS — G4700 Insomnia, unspecified: Secondary | ICD-10-CM

## 2018-12-12 NOTE — Progress Notes (Signed)
[facility] [place], [place] [address]  Internal MEDICINE  Office Visit Note  Patient Name: Tony Collier  [id number]  [id number]  Date of Service: 12/12/2018  Chief Complaint  Patient presents with  . Medical Management of Chronic Issues  . Seizures    HPI  Pt is here for follow up on seizures.  He saw neurology about 2 weeks ago who started him on kepra with his carbamazepine.  He denies having any seizures.  He reports his sleep is still disturbed at times.  He moves around a lot according to his wife, and sometimes he has to go get on the couch because it will keep her up. Neurology has an EEG planned for him soon.     Current Medication: Outpatient Encounter Medications as of 12/12/2018  Medication Sig  . carbamazepine (TEGRETOL) 100 MG chewable tablet Take 4 Tablets in AM and 4 Tablets in PM  . levETIRAcetam (KEPPRA) 500 MG tablet Take by mouth.  . [DISCONTINUED] doxycycline (VIBRA-TABS) 100 MG tablet Take 1 tablet (100 mg total) by mouth 2 (two) times daily. (Patient not taking: Reported on 12/12/2018)   No facility-administered encounter medications on file as of 12/12/2018.     Surgical History: History reviewed. No pertinent surgical history.  Medical History: Past Medical History:  Diagnosis Date  . Insomnia   . Seizures ([place])     Family History: Family History  Family history unknown: Yes    Social History   Socioeconomic History  . Marital status: Married    Spouse name: Not on file  . Number of children: Not on file  . Years of education: Not on file  . Highest education level: Not on file  Occupational History  . Not on file  Social Needs  . Financial resource strain: Not on file  . Food insecurity    Worry: Not on file    Inability: Not on file  . Transportation needs    Medical: Not on file    Non-medical: Not on file  Tobacco Use  . Smoking status: Former Smoker    Quit date: 12/08/2017    Years since  quitting: 1.0  . Smokeless tobacco: Former Network engineer and Sexual Activity  . Alcohol use: Yes    Frequency: Never    Comment: ocassional  . Drug use: Never  . Sexual activity: Not on file  Lifestyle  . Physical activity    Days per week: Not on file    Minutes per session: Not on file  . Stress: Not on file  Relationships  . Social Herbalist on phone: Not on file    Gets together: Not on file    Attends religious service: Not on file    Active member of club or organization: Not on file    Attends meetings of clubs or organizations: Not on file    Relationship status: Not on file  . Intimate partner violence    Fear of current or ex partner: Not on file    Emotionally abused: Not on file    Physically abused: Not on file    Forced sexual activity: Not on file  Other Topics Concern  . Not on file  Social History Narrative  . Not on file      Review of Systems  Constitutional: Negative.  Negative for chills, fatigue and unexpected weight change.  HENT: Negative.  Negative for congestion, rhinorrhea, sneezing and sore throat.  Eyes: Negative for redness.  Respiratory: Negative.  Negative for cough, chest tightness and shortness of breath.   Cardiovascular: Negative.  Negative for chest pain and palpitations.  Gastrointestinal: Negative.  Negative for abdominal pain, constipation, diarrhea, nausea and vomiting.  Endocrine: Negative.   Genitourinary: Negative.  Negative for dysuria and frequency.  Musculoskeletal: Negative.  Negative for arthralgias, back pain, joint swelling and neck pain.  Skin: Negative.  Negative for rash.  Allergic/Immunologic: Negative.   Neurological: Negative.  Negative for tremors and numbness.  Hematological: Negative for adenopathy. Does not bruise/bleed easily.  Psychiatric/Behavioral: Negative.  Negative for behavioral problems, sleep disturbance and suicidal ideas. The patient is not nervous/anxious.     Vital Signs: BP  118/88   Pulse 74   Resp 16   Ht 5\' 6"  (1.676 m)   Wt 210 lb (95.3 kg)   SpO2 95%   BMI 33.89 kg/m    Physical Exam Vitals signs and nursing note reviewed.  Constitutional:      General: He is not in acute distress.    Appearance: He is well-developed. He is not diaphoretic.  HENT:     Head: Normocephalic and atraumatic.     Mouth/Throat:     Pharynx: No oropharyngeal exudate.  Eyes:     Pupils: Pupils are equal, round, and reactive to light.  Neck:     Musculoskeletal: Normal range of motion and neck supple.     Thyroid: No thyromegaly.     Vascular: No JVD.     Trachea: No tracheal deviation.  Cardiovascular:     Rate and Rhythm: Normal rate and regular rhythm.     Heart sounds: Normal heart sounds. No murmur. No friction rub. No gallop.   Pulmonary:     Effort: Pulmonary effort is normal. No respiratory distress.     Breath sounds: Normal breath sounds. No wheezing or rales.  Chest:     Chest wall: No tenderness.  Abdominal:     Palpations: Abdomen is soft.     Tenderness: There is no abdominal tenderness. There is no guarding.  Musculoskeletal: Normal range of motion.  Lymphadenopathy:     Cervical: No cervical adenopathy.  Skin:    General: Skin is warm and dry.  Neurological:     Mental Status: He is alert and oriented to person, place, and time.     Cranial Nerves: No cranial nerve deficit.  Psychiatric:        Behavior: Behavior normal.        Thought Content: Thought content normal.        Judgment: Judgment normal.    Assessment/Plan: 1. Seizure disorder ([place]) Tegratol level ordered.   2. Insomnia, unspecified type Continue to follow up with neurology as planned for EEG.  3. Skin infection, bacterial Resolved, pt is doing well.   General Counseling: Tony Needle understanding of the findings of todays visit and agrees with plan of treatment. I have discussed any further diagnostic evaluation that may be needed or ordered today. We also  reviewed his medications today. he has been encouraged to call the office with any questions or concerns that should arise related to todays visit.    No orders of the defined types were placed in this encounter.   No orders of the defined types were placed in this encounter.   Time spent: 15 Minutes   This patient was seen by Orson Gear AGNP-C in Collaboration with Dr Lavera Guise as a part of collaborative care agreement  Kendell Bane AGNP-C Internal medicine

## 2018-12-15 ENCOUNTER — Ambulatory Visit: Payer: Self-pay | Admitting: Nurse Practitioner

## 2019-03-08 ENCOUNTER — Telehealth: Payer: Self-pay

## 2019-03-08 NOTE — Telephone Encounter (Signed)
Confirmed appointment with patient. klh °

## 2019-03-13 ENCOUNTER — Ambulatory Visit: Payer: Self-pay | Admitting: Adult Health

## 2019-03-29 ENCOUNTER — Telehealth: Payer: Self-pay

## 2019-03-29 NOTE — Telephone Encounter (Signed)
Confirmed virtual visit with patient. klh 

## 2019-04-02 ENCOUNTER — Ambulatory Visit: Payer: Self-pay | Admitting: Adult Health

## 2019-04-02 ENCOUNTER — Encounter: Payer: Self-pay | Admitting: Adult Health

## 2019-04-02 ENCOUNTER — Other Ambulatory Visit: Payer: Self-pay

## 2019-04-02 DIAGNOSIS — R030 Elevated blood-pressure reading, without diagnosis of hypertension: Secondary | ICD-10-CM

## 2019-04-02 DIAGNOSIS — R0683 Snoring: Secondary | ICD-10-CM

## 2019-04-02 DIAGNOSIS — F17219 Nicotine dependence, cigarettes, with unspecified nicotine-induced disorders: Secondary | ICD-10-CM

## 2019-04-02 DIAGNOSIS — G40909 Epilepsy, unspecified, not intractable, without status epilepticus: Secondary | ICD-10-CM

## 2019-04-02 MED ORDER — LEVETIRACETAM 500 MG PO TABS: 500.0000 mg | ORAL_TABLET | Freq: Two times a day (BID) | ORAL | 3 refills | Status: DC

## 2019-04-02 NOTE — Progress Notes (Signed)
[facility] [place], [place] [address]  Internal MEDICINE  Telephone Visit  Patient Name: Tony Collier  [id number]  [id number]  Date of Service: 04/02/2019  I connected with the patient at [phone] by telephone and verified the patients identity using two identifiers.   I discussed the limitations, risks, security and privacy concerns of performing an evaluation and management service by telephone and the availability of in person appointments. I also discussed with the patient that there may be a patient responsible charge related to the service.  The patient expressed understanding and agrees to proceed.    Chief Complaint  Patient presents with  . Telephone Assessment  . Telephone Screen  . Seizures    HPI  Pt seen via telephone. He reports overall he is at his baseline.  He has seen neurology since our last visit. He continues to report chewing his tongue at night.  He remains on Tegretol and keppra at this time.  He reports having an EEG, which was "normal" per patient.  He also had a brain MRI that showed Mild cerebral volume loss which is consistent with his developmental delay per neurology. His wife also describes loud snoring, and that he stops breathing while sleeping.     Current Medication: Outpatient Encounter Medications as of 04/02/2019  Medication Sig  . carbamazepine (TEGRETOL) 100 MG chewable tablet Take 4 Tablets in AM and 4 Tablets in PM  . levETIRAcetam (KEPPRA) 500 MG tablet Take 1 tablet (500 mg total) by mouth 2 (two) times daily.  . [DISCONTINUED] levETIRAcetam (KEPPRA) 500 MG tablet Take by mouth.   No facility-administered encounter medications on file as of 04/02/2019.    Surgical History: History reviewed. No pertinent surgical history.  Medical History: Past Medical History:  Diagnosis Date  . Insomnia   . Seizures ([place])     Family History: Family History  Family history unknown: Yes    Social History    Socioeconomic History  . Marital status: Married    Spouse name: Not on file  . Number of children: Not on file  . Years of education: Not on file  . Highest education level: Not on file  Occupational History  . Not on file  Tobacco Use  . Smoking status: Former Smoker    Quit date: 12/08/2017    Years since quitting: 1.3  . Smokeless tobacco: Former Network engineer and Sexual Activity  . Alcohol use: Yes    Comment: ocassional  . Drug use: Never  . Sexual activity: Not on file  Other Topics Concern  . Not on file  Social History Narrative  . Not on file   Social Determinants of Health   Financial Resource Strain:   . Difficulty of Paying Living Expenses: Not on file  Food Insecurity:   . Worried About Charity fundraiser in the Last Year: Not on file  . Ran Out of Food in the Last Year: Not on file  Transportation Needs:   . Lack of Transportation (Medical): Not on file  . Lack of Transportation (Non-Medical): Not on file  Physical Activity:   . Days of Exercise per Week: Not on file  . Minutes of Exercise per Session: Not on file  Stress:   . Feeling of Stress : Not on file  Social Connections:   . Frequency of Communication with Friends and Family: Not on file  . Frequency of Social Gatherings with Friends and Family: Not on file  .  Attends Religious Services: Not on file  . Active Member of Clubs or Organizations: Not on file  . Attends Archivist Meetings: Not on file  . Marital Status: Not on file  Intimate Partner Violence:   . Fear of Current or Ex-Partner: Not on file  . Emotionally Abused: Not on file  . Physically Abused: Not on file  . Sexually Abused: Not on file      Review of Systems  Constitutional: Negative.  Negative for chills, fatigue and unexpected weight change.  HENT: Negative.  Negative for congestion, rhinorrhea, sneezing and sore throat.   Eyes: Negative for redness.  Respiratory: Negative.  Negative for cough, chest  tightness and shortness of breath.   Cardiovascular: Negative.  Negative for chest pain and palpitations.  Gastrointestinal: Negative.  Negative for abdominal pain, constipation, diarrhea, nausea and vomiting.  Endocrine: Negative.   Genitourinary: Negative.  Negative for dysuria and frequency.  Musculoskeletal: Negative.  Negative for arthralgias, back pain, joint swelling and neck pain.  Skin: Negative.  Negative for rash.  Allergic/Immunologic: Negative.   Neurological: Negative.  Negative for tremors and numbness.  Hematological: Negative for adenopathy. Does not bruise/bleed easily.  Psychiatric/Behavioral: Negative.  Negative for behavioral problems, sleep disturbance and suicidal ideas. The patient is not nervous/anxious.     Vital Signs: There were no vitals taken for this visit.   Observation/Objective:  Well sounding, NAD noted.     Assessment/Plan: 1. Seizure disorder ([place]) Continues to "chew tongue" at night.  Pt will continue keppra and tegretol as before. Refilled keppra at this time. Will continue to follow activity, and   2. Loud snoring Pt should have baseline PSG due to ongoing issues while he sleeps.  Should be done in hospital due to nocturnal seizures. - PSG Sleep Study; Future  3. Elevated blood pressure reading Pt denies any issues with bp recently.  No bp readings are available to me at this time.   4. Cigarette nicotine dependence with nicotine-induced disorder Smoking cessation counseling: 1. Pt acknowledges the risks of long term smoking, she will try to quite smoking. 2. Options for different medications including nicotine products, chewing gum, patch etc, Wellbutrin and Chantix is discussed 3. Goal and date of compete cessation is discussed 4. Total time spent in smoking cessation is 15 min.   General Counseling: dareon sorden understanding of the findings of today's phone visit and agrees with plan of treatment. I have discussed any further  diagnostic evaluation that may be needed or ordered today. We also reviewed his medications today. he has been encouraged to call the office with any questions or concerns that should arise related to todays visit.    Orders Placed This Encounter  Procedures  . PSG Sleep Study    Meds ordered this encounter  Medications  . levETIRAcetam (KEPPRA) 500 MG tablet    Sig: Take 1 tablet (500 mg total) by mouth 2 (two) times daily.    Dispense:  60 tablet    Refill:  3    Time spent: [place] AGNP-C Internal medicine

## 2019-05-14 ENCOUNTER — Encounter: Payer: Self-pay | Admitting: Adult Health

## 2019-05-14 ENCOUNTER — Other Ambulatory Visit: Payer: Self-pay

## 2019-05-14 ENCOUNTER — Ambulatory Visit: Payer: Medicaid Other | Admitting: Adult Health

## 2019-05-14 VITALS — BP 135/79 | HR 72 | Temp 97.4°F | Resp 16 | Ht 66.0 in | Wt 224.0 lb

## 2019-05-14 DIAGNOSIS — G4700 Insomnia, unspecified: Secondary | ICD-10-CM | POA: Diagnosis not present

## 2019-05-14 DIAGNOSIS — R0683 Snoring: Secondary | ICD-10-CM | POA: Diagnosis not present

## 2019-05-14 DIAGNOSIS — G40909 Epilepsy, unspecified, not intractable, without status epilepticus: Secondary | ICD-10-CM

## 2019-05-14 MED ORDER — CARBAMAZEPINE 100 MG PO CHEW: CHEWABLE_TABLET | ORAL | 0 refills | Status: DC

## 2019-05-14 MED ORDER — LEVETIRACETAM 500 MG PO TABS: 500.0000 mg | ORAL_TABLET | Freq: Two times a day (BID) | ORAL | 3 refills | Status: DC

## 2019-05-14 MED ORDER — HYDROXYZINE HCL 10 MG PO TABS: 10.0000 mg | ORAL_TABLET | Freq: Every evening | ORAL | 1 refills | Status: DC | PRN

## 2019-05-14 NOTE — Progress Notes (Signed)
[facility], [place] [address]  Internal MEDICINE  Office Visit Note  Patient Name: Tony Collier  [id number]  [id number]  Date of Service: 05/14/2019  Chief Complaint  Patient presents with  . Follow-up  . Medication Refill    HPI Patient is here today to follow-up on his seizure disorder as well as issues with sleeping. At past visit, wife reported patient was snoring loudly and chewing tongue in the middle of the night. Was restarted on Keppra and continued Tegretol. Was recently seen by neurology, EEG was "normal" per patient, no change in his treatment plan at this time.  PSG was also ordered at last visit, was unable to be done due to the cost to the patient. He explains he does not have insurance at this time and is unable to afford the study. Recently lost his dad, reports having a harder time sleeping at night. No recent seizure activity. Denies chest pain, palpitations or shortness of breath.    Current Medication: Outpatient Encounter Medications as of 05/14/2019  Medication Sig  . carbamazepine (TEGRETOL) 100 MG chewable tablet Take 4 Tablets in AM and 4 Tablets in PM  . levETIRAcetam (KEPPRA) 500 MG tablet Take 1 tablet (500 mg total) by mouth 2 (two) times daily.  . [DISCONTINUED] carbamazepine (TEGRETOL) 100 MG chewable tablet Take 4 Tablets in AM and 4 Tablets in PM  . [DISCONTINUED] levETIRAcetam (KEPPRA) 500 MG tablet Take 1 tablet (500 mg total) by mouth 2 (two) times daily.  . hydrOXYzine (ATARAX/VISTARIL) 10 MG tablet Take 1 tablet (10 mg total) by mouth at bedtime as needed.   No facility-administered encounter medications on file as of 05/14/2019.    Surgical History: History reviewed. No pertinent surgical history.  Medical History: Past Medical History:  Diagnosis Date  . Insomnia   . Seizures ([place])     Family History: Family History  Family history unknown: Yes    Social History   Socioeconomic History  .  Marital status: Married    Spouse name: Not on file  . Number of children: Not on file  . Years of education: Not on file  . Highest education level: Not on file  Occupational History  . Not on file  Tobacco Use  . Smoking status: Former Smoker    Quit date: 12/08/2017    Years since quitting: 1.4  . Smokeless tobacco: Former Network engineer and Sexual Activity  . Alcohol use: Yes    Comment: ocassional  . Drug use: Never  . Sexual activity: Not on file  Other Topics Concern  . Not on file  Social History Narrative  . Not on file   Social Determinants of Health   Financial Resource Strain:   . Difficulty of Paying Living Expenses: Not on file  Food Insecurity:   . Worried About Charity fundraiser in the Last Year: Not on file  . Ran Out of Food in the Last Year: Not on file  Transportation Needs:   . Lack of Transportation (Medical): Not on file  . Lack of Transportation (Non-Medical): Not on file  Physical Activity:   . Days of Exercise per Week: Not on file  . Minutes of Exercise per Session: Not on file  Stress:   . Feeling of Stress : Not on file  Social Connections:   . Frequency of Communication with Friends and Family: Not on file  . Frequency of Social Gatherings with Friends and Family: Not on  file  . Attends Religious Services: Not on file  . Active Member of Clubs or Organizations: Not on file  . Attends Archivist Meetings: Not on file  . Marital Status: Not on file  Intimate Partner Violence:   . Fear of Current or Ex-Partner: Not on file  . Emotionally Abused: Not on file  . Physically Abused: Not on file  . Sexually Abused: Not on file      Review of Systems  Constitutional: Negative.  Negative for chills, fatigue and unexpected weight change.  HENT: Negative.  Negative for congestion, rhinorrhea, sneezing and sore throat.   Eyes: Negative for redness.  Respiratory: Negative.  Negative for cough, chest tightness and shortness of  breath.   Cardiovascular: Negative.  Negative for chest pain and palpitations.  Gastrointestinal: Negative.  Negative for abdominal pain, constipation, diarrhea, nausea and vomiting.  Endocrine: Negative.   Genitourinary: Negative.  Negative for dysuria and frequency.  Musculoskeletal: Negative.  Negative for arthralgias, back pain, joint swelling and neck pain.  Skin: Negative.  Negative for rash.  Allergic/Immunologic: Negative.   Neurological: Negative.  Negative for tremors and numbness.  Hematological: Negative for adenopathy. Does not bruise/bleed easily.  Psychiatric/Behavioral: Negative.  Negative for behavioral problems, sleep disturbance and suicidal ideas. The patient is not nervous/anxious.        Difficulty sleeping    Vital Signs: BP 135/79   Pulse 72   Temp (!) 97.4 F (36.3 C)   Resp 16   Ht 5\' 6"  (1.676 m)   Wt 224 lb (101.6 kg)   SpO2 93%   BMI 36.15 kg/m    Physical Exam Vitals and nursing note reviewed.  Constitutional:      General: He is not in acute distress.    Appearance: He is well-developed. He is not diaphoretic.  HENT:     Head: Normocephalic and atraumatic.     Mouth/Throat:     Pharynx: No oropharyngeal exudate.  Eyes:     Pupils: Pupils are equal, round, and reactive to light.  Neck:     Thyroid: No thyromegaly.     Vascular: No JVD.     Trachea: No tracheal deviation.  Cardiovascular:     Rate and Rhythm: Normal rate and regular rhythm.     Heart sounds: Normal heart sounds. No murmur. No friction rub. No gallop.   Pulmonary:     Effort: Pulmonary effort is normal. No respiratory distress.     Breath sounds: Normal breath sounds. No wheezing or rales.  Chest:     Chest wall: No tenderness.  Abdominal:     Palpations: Abdomen is soft.  Musculoskeletal:        General: Normal range of motion.     Cervical back: Normal range of motion and neck supple.  Lymphadenopathy:     Cervical: No cervical adenopathy.  Skin:    General: Skin  is warm and dry.  Neurological:     Mental Status: He is alert and oriented to person, place, and time.     Cranial Nerves: No cranial nerve deficit.  Psychiatric:        Behavior: Behavior normal.        Thought Content: Thought content normal.        Judgment: Judgment normal.    Assessment/Plan: 1. Seizure disorder (HCC) Chronic seizure disorder. Lab slip provided to have Tegretol levels monitored, plan to have these levels checked routinely every 3 months. Recently reestablished with neurology, no changes made  to treatment plan. Continue current therapy and continue to monitor. - levETIRAcetam (KEPPRA) 500 MG tablet; Take 1 tablet (500 mg total) by mouth 2 (two) times daily.  Dispense: 60 tablet; Refill: 3 - carbamazepine (TEGRETOL) 100 MG chewable tablet; Take 4 Tablets in AM and 4 Tablets in PM  Dispense: 240 tablet; Refill: 0  2. Insomnia, unspecified type Reports having difficulty sleeping at this time, wanted to try medication that would help him fall asleep. - hydrOXYzine (ATARAX/VISTARIL) 10 MG tablet; Take 1 tablet (10 mg total) by mouth at bedtime as needed.  Dispense: 30 tablet; Refill: 1  3. Loud snoring PSG ordered at last visit, due to no insurance coverage at this time PSG not performed due to patient unable to afford test. Will continue to monitor and reevaluate if and when patient gets insurance coverage.   General Counseling: Casimiro Needle understanding of the findings of todays visit and agrees with plan of treatment. I have discussed any further diagnostic evaluation that may be needed or ordered today. We also reviewed his medications today. he has been encouraged to call the office with any questions or concerns that should arise related to todays visit.    No orders of the defined types were placed in this encounter.   Meds ordered this encounter  Medications  . levETIRAcetam (KEPPRA) 500 MG tablet    Sig: Take 1 tablet (500 mg total) by mouth 2 (two)  times daily.    Dispense:  60 tablet    Refill:  3  . carbamazepine (TEGRETOL) 100 MG chewable tablet    Sig: Take 4 Tablets in AM and 4 Tablets in PM    Dispense:  240 tablet    Refill:  0  . hydrOXYzine (ATARAX/VISTARIL) 10 MG tablet    Sig: Take 1 tablet (10 mg total) by mouth at bedtime as needed.    Dispense:  30 tablet    Refill:  1    Time spent: 25 Minutes   This patient was seen by Orson Gear AGNP-C in Collaboration with Dr Lavera Guise as a part of collaborative care agreement     Kendell Bane AGNP-C Internal medicine

## 2019-06-12 ENCOUNTER — Telehealth: Payer: Self-pay

## 2019-06-12 NOTE — Telephone Encounter (Signed)
Confirmed appointment on 06/14/2019 and screened for covid. klh 

## 2019-06-14 ENCOUNTER — Ambulatory Visit: Payer: Self-pay | Admitting: Adult Health

## 2019-06-21 ENCOUNTER — Ambulatory Visit: Payer: Medicaid Other | Attending: Internal Medicine

## 2019-06-21 DIAGNOSIS — Z20822 Contact with and (suspected) exposure to covid-19: Secondary | ICD-10-CM

## 2019-06-22 LAB — NOVEL CORONAVIRUS, NAA: SARS-CoV-2, NAA: NOT DETECTED

## 2019-06-22 LAB — SARS-COV-2, NAA 2 DAY TAT

## 2019-07-04 ENCOUNTER — Telehealth: Payer: Self-pay

## 2019-07-04 NOTE — Telephone Encounter (Signed)
Patient advised paperwork ready for pick up. Tony Collier

## 2019-08-01 ENCOUNTER — Telehealth: Payer: Self-pay

## 2019-08-01 NOTE — Telephone Encounter (Signed)
Patient form up front ready for pick up Tony Collier

## 2019-09-06 ENCOUNTER — Telehealth: Payer: Self-pay

## 2019-09-06 NOTE — Telephone Encounter (Signed)
Confirmed appointment on 09/10/2019 and screened for covid. klh °

## 2019-09-10 ENCOUNTER — Ambulatory Visit: Payer: Self-pay | Admitting: Adult Health

## 2019-09-24 ENCOUNTER — Encounter: Payer: Self-pay | Admitting: Adult Health

## 2019-09-24 ENCOUNTER — Ambulatory Visit: Payer: BLUE CROSS/BLUE SHIELD | Admitting: Adult Health

## 2019-09-24 VITALS — BP 158/99 | HR 67 | Resp 16 | Ht 66.0 in | Wt 220.0 lb

## 2019-09-24 DIAGNOSIS — F17219 Nicotine dependence, cigarettes, with unspecified nicotine-induced disorders: Secondary | ICD-10-CM

## 2019-09-24 DIAGNOSIS — U071 COVID-19: Secondary | ICD-10-CM

## 2019-09-24 MED ORDER — AZITHROMYCIN 250 MG PO TABS: ORAL_TABLET | ORAL | 0 refills | Status: DC

## 2019-09-24 MED ORDER — ALBUTEROL SULFATE HFA 108 (90 BASE) MCG/ACT IN AERS
2.0000 | INHALATION_SPRAY | Freq: Four times a day (QID) | RESPIRATORY_TRACT | 0 refills | Status: DC | PRN
Start: 2019-09-24 — End: 2019-12-24

## 2019-09-24 NOTE — Progress Notes (Signed)
[facility] [place], [place] [address]  Internal MEDICINE  Telephone Visit  Patient Name: Tony Collier  [id number]  [id number]  Date of Service: 09/24/2019  I connected with the patient at [phone] by telephone and verified the patients identity using two identifiers.   I discussed the limitations, risks, security and privacy concerns of performing an evaluation and management service by telephone and the availability of in person appointments. I also discussed with the patient that there may be a patient responsible charge related to the service.  The patient expressed understanding and agrees to proceed.    Chief Complaint  Patient presents with  . Telephone Screen    body aches  . Telephone Assessment    covid test positive  . Leg Pain    pain behind knees  . Headache  . Cough    HPI  Pt seen via telephone.  He reports headache, fever/ chills, pain in both legs.  He tested positive for covid 2 days ago.  He reports he is coughing and feeling bad.  He reports some mild sob with exertion.  He reports he had the first dose of the vaccine at the beginning of June and now has become sick before he could get the second dose.     Current Medication: Outpatient Encounter Medications as of 09/24/2019  Medication Sig  . carbamazepine (TEGRETOL) 100 MG chewable tablet Take 4 Tablets in AM and 4 Tablets in PM  . hydrOXYzine (ATARAX/VISTARIL) 10 MG tablet Take 1 tablet (10 mg total) by mouth at bedtime as needed.  Marland Kitchen albuterol (VENTOLIN HFA) 108 (90 Base) MCG/ACT inhaler Inhale 2 puffs into the lungs every 6 (six) hours as needed for wheezing or shortness of breath.  Marland Kitchen azithromycin (ZITHROMAX) 250 MG tablet Take as directed.  . levETIRAcetam (KEPPRA) 500 MG tablet Take 1 tablet (500 mg total) by mouth 2 (two) times daily.   No facility-administered encounter medications on file as of 09/24/2019.    Surgical History: History reviewed. No pertinent surgical  history.  Medical History: Past Medical History:  Diagnosis Date  . Insomnia   . Seizures ([place])     Family History: Family History  Family history unknown: Yes    Social History   Socioeconomic History  . Marital status: Married    Spouse name: Not on file  . Number of children: Not on file  . Years of education: Not on file  . Highest education level: Not on file  Occupational History  . Not on file  Tobacco Use  . Smoking status: Former Smoker    Quit date: 12/08/2017    Years since quitting: 1.7  . Smokeless tobacco: Former Network engineer  . Vaping Use: Never used  Substance and Sexual Activity  . Alcohol use: Yes    Comment: ocassional  . Drug use: Never  . Sexual activity: Not on file  Other Topics Concern  . Not on file  Social History Narrative  . Not on file   Social Determinants of Health   Financial Resource Strain:   . Difficulty of Paying Living Expenses:   Food Insecurity:   . Worried About Charity fundraiser in the Last Year:   . Arboriculturist in the Last Year:   Transportation Needs:   . Film/video editor (Medical):   Marland Kitchen Lack of Transportation (Non-Medical):   Physical Activity:   . Days of Exercise per Week:   . Minutes of  Exercise per Session:   Stress:   . Feeling of Stress :   Social Connections:   . Frequency of Communication with Friends and Family:   . Frequency of Social Gatherings with Friends and Family:   . Attends Religious Services:   . Active Member of Clubs or Organizations:   . Attends Archivist Meetings:   Marland Kitchen Marital Status:   Intimate Partner Violence:   . Fear of Current or Ex-Partner:   . Emotionally Abused:   Marland Kitchen Physically Abused:   . Sexually Abused:       Review of Systems  Constitutional: Negative.  Negative for chills, fatigue and unexpected weight change.  HENT: Negative.  Negative for congestion, rhinorrhea, sneezing and sore throat.   Eyes: Negative for redness.  Respiratory:  Negative.  Negative for cough, chest tightness and shortness of breath.   Cardiovascular: Negative.  Negative for chest pain and palpitations.  Gastrointestinal: Negative.  Negative for abdominal pain, constipation, diarrhea, nausea and vomiting.  Endocrine: Negative.   Genitourinary: Negative.  Negative for dysuria and frequency.  Musculoskeletal: Negative.  Negative for arthralgias, back pain, joint swelling and neck pain.  Skin: Negative.  Negative for rash.  Allergic/Immunologic: Negative.   Neurological: Negative.  Negative for tremors and numbness.  Hematological: Negative for adenopathy. Does not bruise/bleed easily.  Psychiatric/Behavioral: Negative.  Negative for behavioral problems, sleep disturbance and suicidal ideas. The patient is not nervous/anxious.     Vital Signs: BP (!) 158/99   Pulse 67   Resp 16   Ht 5\' 6"  (1.676 m)   Wt 220 lb (99.8 kg)   BMI 35.51 kg/m    Observation/Objective: Well sounding nad noted.     Assessment/Plan: 1. COVID-19 Advised patient to take entire course of antibiotics as prescribed with food. Pt should return to clinic in 7-10 days if symptoms fail to improve or new symptoms develop.  Use inhaler as discussed for SOB. If symptoms get worse or new symptoms develop call office or go to ED.  - azithromycin (ZITHROMAX) 250 MG tablet; Take as directed.  Dispense: 6 tablet; Refill: 0 - albuterol (VENTOLIN HFA) 108 (90 Base) MCG/ACT inhaler; Inhale 2 puffs into the lungs every 6 (six) hours as needed for wheezing or shortness of breath.  Dispense: 18 g; Refill: 0  2. Cigarette nicotine dependence with nicotine-induced disorder Smoking cessation counseling: 1. Pt acknowledges the risks of long term smoking, she will try to quite smoking. 2. Options for different medications including nicotine products, chewing gum, patch etc, Wellbutrin and Chantix is discussed 3. Goal and date of compete cessation is discussed 4. Total time spent in smoking  cessation is 15 min.   General Counseling: zachary nole understanding of the findings of today's phone visit and agrees with plan of treatment. I have discussed any further diagnostic evaluation that may be needed or ordered today. We also reviewed his medications today. he has been encouraged to call the office with any questions or concerns that should arise related to todays visit.    No orders of the defined types were placed in this encounter.   Meds ordered this encounter  Medications  . azithromycin (ZITHROMAX) 250 MG tablet    Sig: Take as directed.    Dispense:  6 tablet    Refill:  0  . albuterol (VENTOLIN HFA) 108 (90 Base) MCG/ACT inhaler    Sig: Inhale 2 puffs into the lungs every 6 (six) hours as needed for wheezing or shortness of breath.  Dispense:  18 g    Refill:  0    Time spent: [place] AGNP-C Internal medicine

## 2019-09-28 ENCOUNTER — Telehealth: Payer: Self-pay

## 2019-09-28 NOTE — Telephone Encounter (Signed)
Called lmom informing patient of appointment on 10/02/2019. klh

## 2019-09-28 NOTE — Telephone Encounter (Signed)
Confirmed virtual visit on 10/02/2019. klh

## 2019-10-02 ENCOUNTER — Encounter: Payer: Self-pay | Admitting: Adult Health

## 2019-10-02 ENCOUNTER — Ambulatory Visit (INDEPENDENT_AMBULATORY_CARE_PROVIDER_SITE_OTHER): Payer: Medicaid Other | Admitting: Adult Health

## 2019-10-02 VITALS — BP 135/81 | HR 59 | Temp 96.9°F | Resp 16 | Ht 66.0 in | Wt 220.0 lb

## 2019-10-02 DIAGNOSIS — U071 COVID-19: Secondary | ICD-10-CM

## 2019-10-02 DIAGNOSIS — R05 Cough: Secondary | ICD-10-CM

## 2019-10-02 DIAGNOSIS — R059 Cough, unspecified: Secondary | ICD-10-CM

## 2019-10-02 MED ORDER — BENZONATATE 100 MG PO CAPS: 100.0000 mg | ORAL_CAPSULE | Freq: Two times a day (BID) | ORAL | 0 refills | Status: DC | PRN

## 2019-10-02 MED ORDER — GUAIFENESIN-CODEINE 100-10 MG/5ML PO SYRP: 5.0000 mL | ORAL_SOLUTION | Freq: Every evening | ORAL | 0 refills | Status: DC | PRN

## 2019-10-02 NOTE — Progress Notes (Signed)
[facility] [place], [place] [address]  Internal MEDICINE  Telephone Visit  Patient Name: Tony Collier  [id number]  [id number]  Date of Service: 10/02/2019  I connected with the patient at 1210 by telephone and verified the patients identity using two identifiers.   I discussed the limitations, risks, security and privacy concerns of performing an evaluation and management service by telephone and the availability of in person appointments. I also discussed with the patient that there may be a patient responsible charge related to the service.  The patient expressed understanding and agrees to proceed.    Chief Complaint  Patient presents with  . Telephone Assessment    exposed to covid  . Telephone Screen  . Cough    HPI Pt seen via telephone.  He reports about a week of coughing.  He reports his wife and father in law have covid currently. He tested positive for covid on 09/22/2019. He has no other symptoms.      Current Medication: Outpatient Encounter Medications as of 10/02/2019  Medication Sig  . albuterol (VENTOLIN HFA) 108 (90 Base) MCG/ACT inhaler Inhale 2 puffs into the lungs every 6 (six) hours as needed for wheezing or shortness of breath.  . carbamazepine (TEGRETOL) 100 MG chewable tablet Take 4 Tablets in AM and 4 Tablets in PM  . hydrOXYzine (ATARAX/VISTARIL) 10 MG tablet Take 1 tablet (10 mg total) by mouth at bedtime as needed.  . [DISCONTINUED] azithromycin (ZITHROMAX) 250 MG tablet Take as directed.  . levETIRAcetam (KEPPRA) 500 MG tablet Take 1 tablet (500 mg total) by mouth 2 (two) times daily.   No facility-administered encounter medications on file as of 10/02/2019.    Surgical History: History reviewed. No pertinent surgical history.  Medical History: Past Medical History:  Diagnosis Date  . Insomnia   . Seizures ([place])     Family History: Family History  Family history unknown: Yes    Social History    Socioeconomic History  . Marital status: Married    Spouse name: Not on file  . Number of children: Not on file  . Years of education: Not on file  . Highest education level: Not on file  Occupational History  . Not on file  Tobacco Use  . Smoking status: Former Smoker    Quit date: 12/08/2017    Years since quitting: 1.8  . Smokeless tobacco: Former Network engineer  . Vaping Use: Never used  Substance and Sexual Activity  . Alcohol use: Yes    Comment: ocassional  . Drug use: Never  . Sexual activity: Not on file  Other Topics Concern  . Not on file  Social History Narrative  . Not on file   Social Determinants of Health   Financial Resource Strain:   . Difficulty of Paying Living Expenses:   Food Insecurity:   . Worried About Charity fundraiser in the Last Year:   . Arboriculturist in the Last Year:   Transportation Needs:   . Film/video editor (Medical):   Marland Kitchen Lack of Transportation (Non-Medical):   Physical Activity:   . Days of Exercise per Week:   . Minutes of Exercise per Session:   Stress:   . Feeling of Stress :   Social Connections:   . Frequency of Communication with Friends and Family:   . Frequency of Social Gatherings with Friends and Family:   . Attends Religious Services:   . Active Member  of Clubs or Organizations:   . Attends Archivist Meetings:   Marland Kitchen Marital Status:   Intimate Partner Violence:   . Fear of Current or Ex-Partner:   . Emotionally Abused:   Marland Kitchen Physically Abused:   . Sexually Abused:       Review of Systems  Constitutional: Negative.  Negative for chills, fatigue and unexpected weight change.  HENT: Positive for congestion. Negative for rhinorrhea, sneezing and sore throat.   Eyes: Negative for redness.  Respiratory: Positive for cough and shortness of breath. Negative for chest tightness.   Cardiovascular: Negative.  Negative for chest pain and palpitations.  Gastrointestinal: Negative.  Negative for  abdominal pain, constipation, diarrhea, nausea and vomiting.  Endocrine: Negative.   Genitourinary: Negative.  Negative for dysuria and frequency.  Musculoskeletal: Negative.  Negative for arthralgias, back pain, joint swelling and neck pain.  Skin: Negative.  Negative for rash.  Allergic/Immunologic: Negative.   Neurological: Negative.  Negative for tremors and numbness.  Hematological: Negative for adenopathy. Does not bruise/bleed easily.  Psychiatric/Behavioral: Negative.  Negative for behavioral problems, sleep disturbance and suicidal ideas. The patient is not nervous/anxious.     Vital Signs: BP 135/81   Pulse (!) 59   Temp (!) 96.9 F (36.1 C)   Resp 16   Ht 5\' 6"  (1.676 m)   Wt 220 lb (99.8 kg)   BMI 35.51 kg/m    Observation/Objective: Well sounding,nad noted    Assessment/Plan: 1. COVID-19 Continue with medications as before, use Robitussin cough syrup and tessalon pearls as discussed for day and night time treatment of cough. IF symptoms become worse or SOB develops, go to ED.  - guaiFENesin-codeine (ROBITUSSIN AC) 100-10 MG/5ML syrup; Take 5 mLs by mouth at bedtime as needed for cough.  Dispense: 70 mL; Refill: 0 - benzonatate (TESSALON) 100 MG capsule; Take 1 capsule (100 mg total) by mouth 2 (two) times daily as needed for cough.  Dispense: 20 capsule; Refill: 0  2. Cough Reviewed risks and possible side effects associated with taking opiates, benzodiazepines and other CNS depressants. Combination of these could cause dizziness and drowsiness. Advised patient not to drive or operate machinery when taking these medications, as patient's and other's life can be at risk and will have consequences. Patient verbalized understanding in this matter. Dependence and abuse for these drugs will be monitored closely. A Controlled substance policy and procedure is on file which allows [facility] to order a urine drug screen test at any visit. Patient understands and  agrees with the plan - guaiFENesin-codeine (ROBITUSSIN AC) 100-10 MG/5ML syrup; Take 5 mLs by mouth at bedtime as needed for cough.  Dispense: 70 mL; Refill: 0 - benzonatate (TESSALON) 100 MG capsule; Take 1 capsule (100 mg total) by mouth 2 (two) times daily as needed for cough.  Dispense: 20 capsule; Refill: 0  General Counseling: [place] verbalizes understanding of the findings of today's phone visit and agrees with plan of treatment. I have discussed any further diagnostic evaluation that may be needed or ordered today. We also reviewed his medications today. he has been encouraged to call the office with any questions or concerns that should arise related to todays visit.    No orders of the defined types were placed in this encounter.   No orders of the defined types were placed in this encounter.   Time spent: Nunda [facility] Internal medicine

## 2019-10-22 ENCOUNTER — Ambulatory Visit (INDEPENDENT_AMBULATORY_CARE_PROVIDER_SITE_OTHER): Payer: BLUE CROSS/BLUE SHIELD | Admitting: Adult Health

## 2019-10-22 ENCOUNTER — Encounter: Payer: Self-pay | Admitting: Adult Health

## 2019-10-22 VITALS — BP 170/107 | HR 72 | Resp 16 | Ht 66.0 in | Wt 215.0 lb

## 2019-10-22 DIAGNOSIS — G933 Postviral fatigue syndrome: Secondary | ICD-10-CM | POA: Diagnosis not present

## 2019-10-22 DIAGNOSIS — G9331 Postviral fatigue syndrome: Secondary | ICD-10-CM

## 2019-10-22 DIAGNOSIS — R059 Cough, unspecified: Secondary | ICD-10-CM

## 2019-10-22 DIAGNOSIS — R05 Cough: Secondary | ICD-10-CM | POA: Diagnosis not present

## 2019-10-22 MED ORDER — PREDNISONE 10 MG PO TABS: ORAL_TABLET | ORAL | 0 refills | Status: DC

## 2019-10-22 NOTE — Progress Notes (Signed)
[facility] [place], [place] [address]  Internal MEDICINE  Telephone Visit  Patient Name: Tony Collier  [id number]  [id number]  Date of Service: 10/22/2019  I connected with the patient at 419 by telephone and verified the patients identity using two identifiers.   I discussed the limitations, risks, security and privacy concerns of performing an evaluation and management service by telephone and the availability of in person appointments. I also discussed with the patient that there may be a patient responsible charge related to the service.  The patient expressed understanding and agrees to proceed.    Chief Complaint  Patient presents with  . Telephone Assessment    had second covid vac on friday feeling bad since  . Telephone Screen  . Chills    clamy feeling also  . Fatigue    in legs and numbness  . Night Sweats    throughout the day  . sinus drainage  . Generalized Body Aches  . Headache  . Cough    HPI  Pt seen via telephone.  He reports 3 days ago he received his second Covid vaccine. He reports chills, fever, headaches, weakness in his legs and coughing. He had his oridional vaccine in June 2021.  He was diagnosed with covid 09/22/19.  He received his second dose 10/19/19.    Current Medication: Outpatient Encounter Medications as of 10/22/2019  Medication Sig  . albuterol (VENTOLIN HFA) 108 (90 Base) MCG/ACT inhaler Inhale 2 puffs into the lungs every 6 (six) hours as needed for wheezing or shortness of breath.  . benzonatate (TESSALON) 100 MG capsule Take 1 capsule (100 mg total) by mouth 2 (two) times daily as needed for cough.  . carbamazepine (TEGRETOL) 100 MG chewable tablet Take 4 Tablets in AM and 4 Tablets in PM  . guaiFENesin-codeine (ROBITUSSIN AC) 100-10 MG/5ML syrup Take 5 mLs by mouth at bedtime as needed for cough.  . hydrOXYzine (ATARAX/VISTARIL) 10 MG tablet Take 1 tablet (10 mg total) by mouth at bedtime as needed.  .  levETIRAcetam (KEPPRA) 500 MG tablet Take 1 tablet (500 mg total) by mouth 2 (two) times daily.  . predniSONE (DELTASONE) 10 MG tablet Use per dose pack   No facility-administered encounter medications on file as of 10/22/2019.    Surgical History: History reviewed. No pertinent surgical history.  Medical History: Past Medical History:  Diagnosis Date  . Insomnia   . Seizures ([place])     Family History: Family History  Family history unknown: Yes    Social History   Socioeconomic History  . Marital status: Married    Spouse name: Not on file  . Number of children: Not on file  . Years of education: Not on file  . Highest education level: Not on file  Occupational History  . Not on file  Tobacco Use  . Smoking status: Former Smoker    Quit date: 12/08/2017    Years since quitting: 1.8  . Smokeless tobacco: Former Network engineer  . Vaping Use: Never used  Substance and Sexual Activity  . Alcohol use: Yes    Comment: ocassional  . Drug use: Never  . Sexual activity: Not on file  Other Topics Concern  . Not on file  Social History Narrative  . Not on file   Social Determinants of Health   Financial Resource Strain:   . Difficulty of Paying Living Expenses:   Food Insecurity:   . Worried About Estate manager/land agent  of Food in the Last Year:   . [place] in the Last Year:   Transportation Needs:   . Lack of Transportation (Medical):   Marland Kitchen Lack of Transportation (Non-Medical):   Physical Activity:   . Days of Exercise per Week:   . Minutes of Exercise per Session:   Stress:   . Feeling of Stress :   Social Connections:   . Frequency of Communication with Friends and Family:   . Frequency of Social Gatherings with Friends and Family:   . Attends Religious Services:   . Active Member of Clubs or Organizations:   . Attends Archivist Meetings:   Marland Kitchen Marital Status:   Intimate Partner Violence:   . Fear of Current or Ex-Partner:   . Emotionally Abused:    Marland Kitchen Physically Abused:   . Sexually Abused:       Review of Systems  Constitutional: Negative.  Negative for chills, fatigue and unexpected weight change.  HENT: Negative.  Negative for congestion, rhinorrhea, sneezing and sore throat.   Eyes: Negative for redness.  Respiratory: Negative.  Negative for cough, chest tightness and shortness of breath.   Cardiovascular: Negative.  Negative for chest pain and palpitations.  Gastrointestinal: Negative.  Negative for abdominal pain, constipation, diarrhea, nausea and vomiting.  Endocrine: Negative.   Genitourinary: Negative.  Negative for dysuria and frequency.  Musculoskeletal: Negative.  Negative for arthralgias, back pain, joint swelling and neck pain.  Skin: Negative.  Negative for rash.  Allergic/Immunologic: Negative.   Neurological: Negative.  Negative for tremors and numbness.  Hematological: Negative for adenopathy. Does not bruise/bleed easily.  Psychiatric/Behavioral: Negative.  Negative for behavioral problems, sleep disturbance and suicidal ideas. The patient is not nervous/anxious.     Vital Signs: BP (!) 170/107   Pulse 72   Resp 16   Ht 5\' 6"  (1.676 m)   Wt 215 lb (97.5 kg)   BMI 34.70 kg/m    Observation/Objective:  Well sounding, NAD noted   Assessment/Plan: 1. Post viral syndrome Rest, drink plenty of fluids, take NSAIDS daily as discussed.  Reevaluate in 24-48 hours.  If symptoms fail to improve call clinic.   2. Cough Mild, non productive.  Continue to follow.  General Counseling: won kreuzer understanding of the findings of today's phone visit and agrees with plan of treatment. I have discussed any further diagnostic evaluation that may be needed or ordered today. We also reviewed his medications today. he has been encouraged to call the office with any questions or concerns that should arise related to todays visit.    No orders of the defined types were placed in this encounter.   Meds  ordered this encounter  Medications  . predniSONE (DELTASONE) 10 MG tablet    Sig: Use per dose pack    Dispense:  21 tablet    Refill:  0    Time spent: [place] AGNP-C Internal medicine

## 2019-11-03 ENCOUNTER — Other Ambulatory Visit: Payer: Self-pay | Admitting: Adult Health

## 2019-11-03 DIAGNOSIS — G40909 Epilepsy, unspecified, not intractable, without status epilepticus: Secondary | ICD-10-CM

## 2019-11-05 ENCOUNTER — Other Ambulatory Visit: Payer: Self-pay

## 2019-11-05 DIAGNOSIS — G40909 Epilepsy, unspecified, not intractable, without status epilepticus: Secondary | ICD-10-CM

## 2019-11-05 MED ORDER — CARBAMAZEPINE 100 MG PO CHEW: CHEWABLE_TABLET | ORAL | 3 refills | Status: DC

## 2019-12-24 ENCOUNTER — Ambulatory Visit (INDEPENDENT_AMBULATORY_CARE_PROVIDER_SITE_OTHER): Payer: BLUE CROSS/BLUE SHIELD | Admitting: Adult Health

## 2019-12-24 ENCOUNTER — Other Ambulatory Visit: Payer: Self-pay

## 2019-12-24 ENCOUNTER — Encounter (INDEPENDENT_AMBULATORY_CARE_PROVIDER_SITE_OTHER): Payer: Self-pay

## 2019-12-24 ENCOUNTER — Encounter: Payer: Self-pay | Admitting: Adult Health

## 2019-12-24 VITALS — BP 150/100 | HR 62 | Temp 97.6°F | Resp 16 | Ht 66.0 in | Wt 217.6 lb

## 2019-12-24 DIAGNOSIS — I10 Essential (primary) hypertension: Secondary | ICD-10-CM

## 2019-12-24 DIAGNOSIS — G40909 Epilepsy, unspecified, not intractable, without status epilepticus: Secondary | ICD-10-CM | POA: Diagnosis not present

## 2019-12-24 DIAGNOSIS — M545 Low back pain, unspecified: Secondary | ICD-10-CM

## 2019-12-24 DIAGNOSIS — N529 Male erectile dysfunction, unspecified: Secondary | ICD-10-CM

## 2019-12-24 LAB — POCT URINALYSIS DIPSTICK
Bilirubin, UA: NEGATIVE
Blood, UA: NEGATIVE
Glucose, UA: NEGATIVE
Ketones, UA: NEGATIVE
Leukocytes, UA: NEGATIVE
Nitrite, UA: NEGATIVE
Protein, UA: POSITIVE — AB
Spec Grav, UA: 1.01 (ref 1.010–1.025)
Urobilinogen, UA: 0.2 E.U./dL
pH, UA: 6.5 (ref 5.0–8.0)

## 2019-12-24 MED ORDER — CYCLOBENZAPRINE HCL 5 MG PO TABS: 5.0000 mg | ORAL_TABLET | Freq: Three times a day (TID) | ORAL | 0 refills | Status: DC | PRN

## 2019-12-24 MED ORDER — CARBAMAZEPINE 100 MG PO CHEW: CHEWABLE_TABLET | ORAL | 3 refills | Status: DC

## 2019-12-24 MED ORDER — LEVETIRACETAM 500 MG PO TABS: 500.0000 mg | ORAL_TABLET | Freq: Two times a day (BID) | ORAL | 3 refills | Status: DC

## 2019-12-24 MED ORDER — AMLODIPINE BESYLATE 2.5 MG PO TABS: 2.5000 mg | ORAL_TABLET | Freq: Every day | ORAL | 1 refills | Status: DC

## 2019-12-24 NOTE — Progress Notes (Signed)
[facility] [place], [place] [address]  Internal MEDICINE  Office Visit Note  Patient Name: Tony Collier  [id number]  [id number]  Date of Service: 12/24/2019  Chief Complaint  Patient presents with  . Back Pain    has burning with urine, no discharge, no itching, no odor started a few months ago  . controlled substance policy    acknowledged  . Medication Refill    HPI  Pt is seen today for follow up.  He reports bilateral flank pain intermittently for a few months. He describes the pain as sharp pain in his middle back.  He reports he lifts some moderately heavy things for work.  He reports the pain in his back his worse at night when he lays down. Denies any paraesthesia.  He is complaining of also having issues with maintaining an Erection.      Current Medication: Outpatient Encounter Medications as of 12/24/2019  Medication Sig  . carbamazepine (TEGRETOL) 100 MG chewable tablet Take 4 Tablets in AM and 4 Tablets in PM  . [DISCONTINUED] carbamazepine (TEGRETOL) 100 MG chewable tablet Take 4 Tablets in AM and 4 Tablets in PM  . levETIRAcetam (KEPPRA) 500 MG tablet Take 1 tablet (500 mg total) by mouth 2 (two) times daily.  . [DISCONTINUED] albuterol (VENTOLIN HFA) 108 (90 Base) MCG/ACT inhaler Inhale 2 puffs into the lungs every 6 (six) hours as needed for wheezing or shortness of breath. (Patient not taking: Reported on 12/24/2019)  . [DISCONTINUED] benzonatate (TESSALON) 100 MG capsule Take 1 capsule (100 mg total) by mouth 2 (two) times daily as needed for cough. (Patient not taking: Reported on 12/24/2019)  . [DISCONTINUED] guaiFENesin-codeine (ROBITUSSIN AC) 100-10 MG/5ML syrup Take 5 mLs by mouth at bedtime as needed for cough. (Patient not taking: Reported on 12/24/2019)  . [DISCONTINUED] hydrOXYzine (ATARAX/VISTARIL) 10 MG tablet Take 1 tablet (10 mg total) by mouth at bedtime as needed. (Patient not taking: Reported on 12/24/2019)  .  [DISCONTINUED] levETIRAcetam (KEPPRA) 500 MG tablet Take 1 tablet (500 mg total) by mouth 2 (two) times daily.  . [DISCONTINUED] predniSONE (DELTASONE) 10 MG tablet Use per dose pack (Patient not taking: Reported on 12/24/2019)   No facility-administered encounter medications on file as of 12/24/2019.    Surgical History: History reviewed. No pertinent surgical history.  Medical History: Past Medical History:  Diagnosis Date  . Insomnia   . Seizures ([place])     Family History: Family History  Problem Relation Age of Onset  . Hypertension Mother     Social History   Socioeconomic History  . Marital status: Married    Spouse name: Not on file  . Number of children: Not on file  . Years of education: Not on file  . Highest education level: Not on file  Occupational History  . Not on file  Tobacco Use  . Smoking status: Former Smoker    Quit date: 12/08/2017    Years since quitting: 2.0  . Smokeless tobacco: Former Network engineer  . Vaping Use: Never used  Substance and Sexual Activity  . Alcohol use: Yes    Comment: ocassional  . Drug use: Never  . Sexual activity: Not on file  Other Topics Concern  . Not on file  Social History Narrative  . Not on file   Social Determinants of Health   Financial Resource Strain:   . Difficulty of Paying Living Expenses: Not on file  Food Insecurity:   .  Worried About Charity fundraiser in the Last Year: Not on file  . Ran Out of Food in the Last Year: Not on file  Transportation Needs:   . Lack of Transportation (Medical): Not on file  . Lack of Transportation (Non-Medical): Not on file  Physical Activity:   . Days of Exercise per Week: Not on file  . Minutes of Exercise per Session: Not on file  Stress:   . Feeling of Stress : Not on file  Social Connections:   . Frequency of Communication with Friends and Family: Not on file  . Frequency of Social Gatherings with Friends and Family: Not on file  . Attends Religious  Services: Not on file  . Active Member of Clubs or Organizations: Not on file  . Attends Archivist Meetings: Not on file  . Marital Status: Not on file  Intimate Partner Violence:   . Fear of Current or Ex-Partner: Not on file  . Emotionally Abused: Not on file  . Physically Abused: Not on file  . Sexually Abused: Not on file      Review of Systems  Constitutional: Negative.  Negative for chills, fatigue and unexpected weight change.  HENT: Negative.  Negative for congestion, rhinorrhea, sneezing and sore throat.   Eyes: Negative for redness.  Respiratory: Negative.  Negative for cough, chest tightness and shortness of breath.   Cardiovascular: Negative.  Negative for chest pain and palpitations.  Gastrointestinal: Negative.  Negative for abdominal pain, constipation, diarrhea, nausea and vomiting.  Endocrine: Negative.   Genitourinary: Negative.  Negative for dysuria and frequency.  Musculoskeletal: Positive for back pain. Negative for arthralgias, joint swelling and neck pain.  Skin: Negative.  Negative for rash.  Allergic/Immunologic: Negative.   Neurological: Negative.  Negative for tremors and numbness.  Hematological: Negative for adenopathy. Does not bruise/bleed easily.  Psychiatric/Behavioral: Negative.  Negative for behavioral problems, sleep disturbance and suicidal ideas. The patient is not nervous/anxious.     Vital Signs: BP (!) 163/90   Pulse 62   Temp 97.6 F (36.4 C)   Resp 16   Ht 5\' 6"  (1.676 m)   Wt 217 lb 9.6 oz (98.7 kg)   SpO2 97%   BMI 35.12 kg/m    Physical Exam Musculoskeletal:     Comments: Pain with palpation of lower back.      Assessment/Plan: 1. Essential hypertension Start norvasc nightly, and follow up in 3 weeks.  - amLODipine (NORVASC) 2.5 MG tablet; Take 1 tablet (2.5 mg total) by mouth daily.  Dispense: 30 tablet; Refill: 1  2. Seizure disorder (HCC) Stable, continue keppra and tegretol - levETIRAcetam (KEPPRA)  500 MG tablet; Take 1 tablet (500 mg total) by mouth 2 (two) times daily.  Dispense: 60 tablet; Refill: 3 - carbamazepine (TEGRETOL) 100 MG chewable tablet; Take 4 Tablets in AM and 4 Tablets in PM  Dispense: 240 tablet; Refill: 3  3. Acute midline low back pain without sciatica Have labs done. Use flexeril as well as rest, heat, and NSAIDS as discussed.  Follow up in office if symptoms fail to improve.  - Comprehensive metabolic panel - cyclobenzaprine (FLEXERIL) 5 MG tablet; Take 1 tablet (5 mg total) by mouth 3 (three) times daily as needed for muscle spasms.  Dispense: 20 tablet; Refill: 0  4. Erectile dysfunction, unspecified erectile dysfunction type Discussed that we will get blood pressure under control and then discuss medications.   General Counseling: Tony Needle understanding of the findings of todays visit  and agrees with plan of treatment. I have discussed any further diagnostic evaluation that may be needed or ordered today. We also reviewed his medications today. he has been encouraged to call the office with any questions or concerns that should arise related to todays visit.    No orders of the defined types were placed in this encounter.   Meds ordered this encounter  Medications  . levETIRAcetam (KEPPRA) 500 MG tablet    Sig: Take 1 tablet (500 mg total) by mouth 2 (two) times daily.    Dispense:  60 tablet    Refill:  3  . carbamazepine (TEGRETOL) 100 MG chewable tablet    Sig: Take 4 Tablets in AM and 4 Tablets in PM    Dispense:  240 tablet    Refill:  3    Time spent: 30 Minutes   This patient was seen by Orson Gear AGNP-C in Collaboration with Dr Lavera Guise as a part of collaborative care agreement     Kendell Bane AGNP-C Internal medicine

## 2019-12-26 LAB — COMPREHENSIVE METABOLIC PANEL
ALT: 24 IU/L (ref 0–44)
AST: 18 IU/L (ref 0–40)
Albumin/Globulin Ratio: 1.6 (ref 1.2–2.2)
Albumin: 4.5 g/dL (ref 4.0–5.0)
Alkaline Phosphatase: 93 IU/L (ref 44–121)
BUN/Creatinine Ratio: 7 — ABNORMAL LOW (ref 9–20)
BUN: 8 mg/dL (ref 6–24)
Bilirubin Total: <0.2 mg/dL (ref 0.0–1.2)
CO2: 19 mmol/L — ABNORMAL LOW (ref 20–29)
Calcium: 9.6 mg/dL (ref 8.7–10.2)
Chloride: 105 mmol/L (ref 96–106)
Creatinine, Ser: 1.07 mg/dL (ref 0.76–1.27)
GFR calc Af Amer: 96 mL/min/{1.73_m2} (ref >59)
GFR calc non Af Amer: 83 mL/min/{1.73_m2} (ref >59)
Globulin, Total: 2.9 g/dL (ref 1.5–4.5)
Glucose: 150 mg/dL — ABNORMAL HIGH (ref 65–99)
Potassium: 4.1 mmol/L (ref 3.5–5.2)
Sodium: 139 mmol/L (ref 134–144)
Total Protein: 7.4 g/dL (ref 6.0–8.5)

## 2019-12-31 ENCOUNTER — Telehealth: Payer: Self-pay

## 2019-12-31 NOTE — Telephone Encounter (Signed)
Left a message and advised form ready for pick up, the past time this was denied for disability and he will need to get a blank form and have his neurologist fill this out due to his seizure disability. Tony Collier

## 2020-01-15 ENCOUNTER — Ambulatory Visit (INDEPENDENT_AMBULATORY_CARE_PROVIDER_SITE_OTHER): Payer: BLUE CROSS/BLUE SHIELD | Admitting: Nurse Practitioner

## 2020-01-15 ENCOUNTER — Encounter: Payer: Self-pay | Admitting: Nurse Practitioner

## 2020-01-15 ENCOUNTER — Other Ambulatory Visit: Payer: Self-pay

## 2020-01-15 VITALS — BP 150/87 | HR 99 | Temp 97.5°F | Resp 16 | Ht 66.0 in | Wt 218.8 lb

## 2020-01-15 DIAGNOSIS — G40909 Epilepsy, unspecified, not intractable, without status epilepticus: Secondary | ICD-10-CM | POA: Diagnosis not present

## 2020-01-15 DIAGNOSIS — I10 Essential (primary) hypertension: Secondary | ICD-10-CM

## 2020-01-15 MED ORDER — AMLODIPINE BESYLATE 2.5 MG PO TABS: 2.5000 mg | ORAL_TABLET | Freq: Every day | ORAL | 1 refills | Status: DC

## 2020-01-15 MED ORDER — AMLODIPINE BESYLATE 5 MG PO TABS: 5.0000 mg | ORAL_TABLET | Freq: Every day | ORAL | 1 refills | Status: DC

## 2020-01-15 NOTE — Progress Notes (Signed)
[facility] [place], [place] [address]  Internal MEDICINE  Office Visit Note  Patient Name: Tony Collier  [id number]  [id number]  Date of Service: 02/08/2020  Chief Complaint  Patient presents with  . Follow-up  . Quality Metric Gaps    tetnaus Hep C  . Hypertension    The patient is here for follow up of blood pressure. Was started on amlodipine 2.5mg  at his last visit. Blood pressure has not improved much. Patient states that he is still having headaches. No other negative side effects are reported. He denies chest pain, chest pressure, shortness of breath, or headaches.       Current Medication: Outpatient Encounter Medications as of 01/15/2020  Medication Sig  . amLODipine (NORVASC) 5 MG tablet Take 1 tablet (5 mg total) by mouth daily.  . carbamazepine (TEGRETOL) 100 MG chewable tablet Take 4 Tablets in AM and 4 Tablets in PM  . cyclobenzaprine (FLEXERIL) 5 MG tablet Take 1 tablet (5 mg total) by mouth 3 (three) times daily as needed for muscle spasms.  Marland Kitchen levETIRAcetam (KEPPRA) 500 MG tablet Take 1 tablet (500 mg total) by mouth 2 (two) times daily.  . [DISCONTINUED] amLODipine (NORVASC) 2.5 MG tablet Take 1 tablet (2.5 mg total) by mouth daily.  . [DISCONTINUED] amLODipine (NORVASC) 2.5 MG tablet Take 1 tablet (2.5 mg total) by mouth daily.   No facility-administered encounter medications on file as of 01/15/2020.    Surgical History: History reviewed. No pertinent surgical history.  Medical History: Past Medical History:  Diagnosis Date  . Insomnia   . Seizures ([place])     Family History: Family History  Problem Relation Age of Onset  . Hypertension Mother     Social History   Socioeconomic History  . Marital status: Married    Spouse name: Not on file  . Number of children: Not on file  . Years of education: Not on file  . Highest education level: Not on file  Occupational History  . Not on file  Tobacco Use  . Smoking  status: Former Smoker    Quit date: 12/08/2017    Years since quitting: 2.1  . Smokeless tobacco: Never Used  Vaping Use  . Vaping Use: Never used  Substance and Sexual Activity  . Alcohol use: Yes    Comment: ocassional  . Drug use: Never  . Sexual activity: Not on file  Other Topics Concern  . Not on file  Social History Narrative  . Not on file   Social Determinants of Health   Financial Resource Strain:   . Difficulty of Paying Living Expenses: Not on file  Food Insecurity:   . Worried About Charity fundraiser in the Last Year: Not on file  . Ran Out of Food in the Last Year: Not on file  Transportation Needs:   . Lack of Transportation (Medical): Not on file  . Lack of Transportation (Non-Medical): Not on file  Physical Activity:   . Days of Exercise per Week: Not on file  . Minutes of Exercise per Session: Not on file  Stress:   . Feeling of Stress : Not on file  Social Connections:   . Frequency of Communication with Friends and Family: Not on file  . Frequency of Social Gatherings with Friends and Family: Not on file  . Attends Religious Services: Not on file  . Active Member of Clubs or Organizations: Not on file  . Attends Archivist Meetings:  Not on file  . Marital Status: Not on file  Intimate Partner Violence:   . Fear of Current or Ex-Partner: Not on file  . Emotionally Abused: Not on file  . Physically Abused: Not on file  . Sexually Abused: Not on file      Review of Systems  Constitutional: Negative for activity change, chills, fatigue and unexpected weight change.  HENT: Negative for congestion, postnasal drip, rhinorrhea, sneezing and sore throat.   Respiratory: Negative for cough, chest tightness and shortness of breath.   Cardiovascular: Negative for chest pain and palpitations.       Blood pressure elevated.   Gastrointestinal: Negative for abdominal pain, constipation, diarrhea, nausea and vomiting.  Musculoskeletal: Negative for  arthralgias, back pain, joint swelling and neck pain.  Skin: Negative for rash.  Neurological: Positive for seizures. Negative for tremors and numbness.       Well managed seizure disorder.   Hematological: Negative for adenopathy. Does not bruise/bleed easily.  Psychiatric/Behavioral: Negative for behavioral problems (Depression), sleep disturbance and suicidal ideas. The patient is not nervous/anxious.     Today's Vitals   01/15/20 1448  BP: (!) 150/87  Pulse: 99  Resp: 16  Temp: (!) 97.5 F (36.4 C)  SpO2: 98%  Weight: 218 lb 12.8 oz (99.2 kg)  Height: 5\' 6"  (1.676 m)   Body mass index is 35.32 kg/m.  Physical Exam Vitals and nursing note reviewed.  Constitutional:      General: He is not in acute distress.    Appearance: Normal appearance. He is well-developed. He is not diaphoretic.  HENT:     Head: Normocephalic and atraumatic.     Nose: Nose normal.     Mouth/Throat:     Pharynx: No oropharyngeal exudate.  Eyes:     Pupils: Pupils are equal, round, and reactive to light.  Neck:     Thyroid: No thyromegaly.     Vascular: No carotid bruit or JVD.     Trachea: No tracheal deviation.  Cardiovascular:     Rate and Rhythm: Normal rate and regular rhythm.     Heart sounds: Normal heart sounds. No murmur heard.  No friction rub. No gallop.   Pulmonary:     Effort: Pulmonary effort is normal. No respiratory distress.     Breath sounds: Normal breath sounds. No wheezing or rales.  Chest:     Chest wall: No tenderness.  Abdominal:     General: Bowel sounds are normal.     Palpations: Abdomen is soft.  Musculoskeletal:        General: Normal range of motion.     Cervical back: Normal range of motion and neck supple.  Lymphadenopathy:     Cervical: No cervical adenopathy.  Skin:    General: Skin is warm and dry.  Neurological:     Mental Status: He is alert and oriented to person, place, and time. Mental status is at baseline.     Cranial Nerves: No cranial nerve  deficit.  Psychiatric:        Mood and Affect: Mood normal.        Behavior: Behavior normal.        Thought Content: Thought content normal.        Judgment: Judgment normal.    Assessment/Plan: 1. Essential hypertension Increased amlodipine to 5mg  daily. Limit salt intake and increase water in the diet. Monitor closely.  - amLODipine (NORVASC) 5 MG tablet; Take 1 tablet (5 mg total) by mouth daily.  Dispense: 30 tablet; Refill: 1  2. Seizure disorder ([place]) Stable. Continue keppra and tegretol as prescribed   General Counseling: Tony Collier understanding of the findings of todays visit and agrees with plan of treatment. I have discussed any further diagnostic evaluation that may be needed or ordered today. We also reviewed his medications today. he has been encouraged to call the office with any questions or concerns that should arise related to todays visit.   Hypertension Counseling:   The following hypertensive lifestyle modification were recommended and discussed:  1. Limiting alcohol intake to less than 1 oz/day of ethanol:(24 oz of beer or 8 oz of wine or 2 oz of 100-proof whiskey). 2. Take baby ASA 81 mg daily. 3. Importance of regular aerobic exercise and losing weight. 4. Reduce dietary saturated fat and cholesterol intake for overall cardiovascular health. 5. Maintaining adequate dietary potassium, calcium, and magnesium intake. 6. Regular monitoring of the blood pressure. 7. Reduce sodium intake to less than 100 mmol/day (less than 2.3 gm of sodium or less than 6 gm of sodium choride)   This patient was seen by [place] with Dr Lavera Guise as a part of collaborative care agreement  Meds ordered this encounter  Medications  . DISCONTD: amLODipine (NORVASC) 2.5 MG tablet    Sig: Take 1 tablet (2.5 mg total) by mouth daily.    Dispense:  30 tablet    Refill:  1    Order Specific Question:   Supervising Provider    Answer:   Lavera Guise  [[id number]]  . amLODipine (NORVASC) 5 MG tablet    Sig: Take 1 tablet (5 mg total) by mouth daily.    Dispense:  30 tablet    Refill:  1    Please disregard first prescription. Increased dose to 5mg  daily.    Order Specific Question:   Supervising Provider    Answer:   Lavera Guise [[id number]]    Total time spent: 25 Minutes   Time spent includes review of chart, medications, test results, and follow up plan with the patient.      Dr Lavera Guise Internal medicine

## 2020-01-25 ENCOUNTER — Ambulatory Visit
Admission: EM | Admit: 2020-01-25 | Discharge: 2020-01-25 | Disposition: A | Discharge status: Home / Self Care | Payer: BLUE CROSS/BLUE SHIELD | Attending: Family Medicine | Admitting: Family Medicine

## 2020-01-25 ENCOUNTER — Other Ambulatory Visit: Payer: Self-pay

## 2020-01-25 DIAGNOSIS — M79671 Pain in right foot: Secondary | ICD-10-CM | POA: Diagnosis not present

## 2020-01-25 DIAGNOSIS — M722 Plantar fascial fibromatosis: Secondary | ICD-10-CM | POA: Diagnosis not present

## 2020-01-25 MED ORDER — PREDNISONE 10 MG (21) PO TBPK: ORAL_TABLET | Freq: Every day | ORAL | 0 refills | Status: AC

## 2020-01-25 NOTE — Discharge Instructions (Addendum)
I think you have plantar fasciitis.   I have sent in a prednisone taper for you to take for 6 days. 6 tablets on day one, 5 tablets on day two, 4 tablets on day three, 3 tablets on day four, 2 tablets on day five, and 1 tablet on day six.  Follow up with this office or with primary care if symptoms are persisting.  Follow up in the ER for high fever, trouble swallowing, trouble breathing, other concerning symptoms.

## 2020-01-25 NOTE — ED Triage Notes (Signed)
Pt reports having R foot pain that began last night. No known injury to foot. Pain is on the bottom of foot. Painful to walk.

## 2020-01-25 NOTE — ED Provider Notes (Signed)
[place]   [id number] 01/25/20 Arrival Time: 1324  NF:AOZHY PAIN  SUBJECTIVE: History from: patient. Tony Collier is a 44 y.o. male complains of pain to the sole of the right foot since last night. Denies a precipitating event or specific injury. Describes the pain as intermittent and achy in character.  Has tried OTC medications without relief. Symptoms are made worse with activity. Denies similar symptoms in the past.  Denies fever, chills, erythema, ecchymosis, effusion, weakness, numbness and tingling, saddle paresthesias, loss of bowel or bladder function.      ROS: As per HPI.  All other pertinent ROS negative.     Past Medical History:  Diagnosis Date  . Insomnia   . Seizures ([place])    History reviewed. No pertinent surgical history. No Known Allergies No current facility-administered medications on file prior to encounter.   Current Outpatient Medications on File Prior to Encounter  Medication Sig Dispense Refill  . amLODipine (NORVASC) 5 MG tablet Take 1 tablet (5 mg total) by mouth daily. 30 tablet 1  . carbamazepine (TEGRETOL) 100 MG chewable tablet Take 4 Tablets in AM and 4 Tablets in PM 240 tablet 3  . cyclobenzaprine (FLEXERIL) 5 MG tablet Take 1 tablet (5 mg total) by mouth 3 (three) times daily as needed for muscle spasms. 20 tablet 0  . levETIRAcetam (KEPPRA) 500 MG tablet Take 1 tablet (500 mg total) by mouth 2 (two) times daily. 60 tablet 3   Social History   Socioeconomic History  . Marital status: Married    Spouse name: Not on file  . Number of children: Not on file  . Years of education: Not on file  . Highest education level: Not on file  Occupational History  . Not on file  Tobacco Use  . Smoking status: Former Smoker    Quit date: 12/08/2017    Years since quitting: 2.1  . Smokeless tobacco: Never Used  Vaping Use  . Vaping Use: Never used  Substance and Sexual Activity  . Alcohol use: Yes    Comment: ocassional  . Drug use:  Never  . Sexual activity: Not on file  Other Topics Concern  . Not on file  Social History Narrative  . Not on file   Social Determinants of Health   Financial Resource Strain:   . Difficulty of Paying Living Expenses: Not on file  Food Insecurity:   . Worried About Charity fundraiser in the Last Year: Not on file  . Ran Out of Food in the Last Year: Not on file  Transportation Needs:   . Lack of Transportation (Medical): Not on file  . Lack of Transportation (Non-Medical): Not on file  Physical Activity:   . Days of Exercise per Week: Not on file  . Minutes of Exercise per Session: Not on file  Stress:   . Feeling of Stress : Not on file  Social Connections:   . Frequency of Communication with Friends and Family: Not on file  . Frequency of Social Gatherings with Friends and Family: Not on file  . Attends Religious Services: Not on file  . Active Member of Clubs or Organizations: Not on file  . Attends Archivist Meetings: Not on file  . Marital Status: Not on file  Intimate Partner Violence:   . Fear of Current or Ex-Partner: Not on file  . Emotionally Abused: Not on file  . Physically Abused: Not on file  . Sexually Abused: Not on file  Family History  Problem Relation Age of Onset  . Hypertension Mother     OBJECTIVE:  Vitals:   01/25/20 1333  BP: (!) 139/91  Pulse: 67  Resp: 18  Temp: 98 F (36.7 C)  TempSrc: Oral  SpO2: 98%  Weight: 210 lb (95.3 kg)  Height: 5\' 6"  (1.676 m)    General appearance: ALERT; in no acute distress.  Head: NCAT Lungs: Normal respiratory effort CV: XX pulses 2+ bilaterally. Cap refill < 2 seconds Musculoskeletal:  Inspection: Skin warm, dry, clear and intact without obvious erythema, effusion, or ecchymosis.  Palpation: Plantar fascia tender to palpation ROM: FROM active and passive Skin: warm and dry Neurologic: Ambulates without difficulty; Sensation intact about the upper/ lower extremities Psychological:  alert and cooperative; normal mood and affect  DIAGNOSTIC STUDIES:  No results found.   ASSESSMENT & PLAN:  1. Right foot pain   2. Plantar fasciitis of right foot      Meds ordered this encounter  Medications  . predniSONE (STERAPRED UNI-PAK 21 TAB) 10 MG (21) TBPK tablet    Sig: Take by mouth daily for 6 days. Take 6 tablets on day 1, 5 tablets on day 2, 4 tablets on day 3, 3 tablets on day 4, 2 tablets on day 5, 1 tablet on day 6    Dispense:  21 tablet    Refill:  0    Order Specific Question:   Supervising Provider    Answer:   Chase Picket [id number]   Steroid taper prescribed Wear shoes on hard floors for support Continue conservative management of rest, ice, and gentle stretches Take ibuprofen as needed for pain relief (may cause abdominal discomfort, ulcers, and GI bleeds avoid taking with other NSAIDs)  Follow up with PCP if symptoms persist Return or go to the ER if you have any new or worsening symptoms (fever, chills, chest pain, abdominal pain, changes in bowel or bladder habits, pain radiating into lower legs)   Reviewed expectations re: course of current medical issues. Questions answered. Outlined signs and symptoms indicating need for more acute intervention. Patient verbalized understanding. After Visit Summary given.       Faustino Congress, NP 01/25/20 1558

## 2020-02-06 ENCOUNTER — Ambulatory Visit (INDEPENDENT_AMBULATORY_CARE_PROVIDER_SITE_OTHER): Payer: Medicaid Other | Admitting: Nurse Practitioner

## 2020-02-06 ENCOUNTER — Other Ambulatory Visit: Payer: Self-pay

## 2020-02-06 ENCOUNTER — Encounter: Payer: Self-pay | Admitting: Nurse Practitioner

## 2020-02-06 VITALS — BP 152/90 | HR 72 | Temp 97.5°F | Resp 16 | Ht 66.0 in | Wt 221.6 lb

## 2020-02-06 DIAGNOSIS — I10 Essential (primary) hypertension: Secondary | ICD-10-CM | POA: Diagnosis not present

## 2020-02-06 DIAGNOSIS — G40909 Epilepsy, unspecified, not intractable, without status epilepticus: Secondary | ICD-10-CM

## 2020-02-06 MED ORDER — HYDROCHLOROTHIAZIDE 12.5 MG PO TABS: 12.5000 mg | ORAL_TABLET | Freq: Every day | ORAL | 1 refills | Status: DC

## 2020-02-06 NOTE — Progress Notes (Signed)
[facility] [place], [place] [address]  Internal MEDICINE  Office Visit Note  Patient Name: Tony Collier  [id number]  [id number]  Date of Service: 03/12/2020  Chief Complaint  Patient presents with  . Follow-up    BP med  . policy update form    received    The patient is here for follow up. His amlodipine was increased to 5mg  at his last visit. Though improved, his systolic blood pressure is still elevated. Has tolerated the new medication well. No negative side effects. Is limiting sodium and increasing water in his diet. He denies chest pain, chest pressure, headache, or unusual shortness of breath.       Current Medication: Outpatient Encounter Medications as of 02/06/2020  Medication Sig  . amLODipine (NORVASC) 5 MG tablet Take 1 tablet (5 mg total) by mouth daily.  . carbamazepine (TEGRETOL) 100 MG chewable tablet Take 4 Tablets in AM and 4 Tablets in PM  . cyclobenzaprine (FLEXERIL) 5 MG tablet Take 1 tablet (5 mg total) by mouth 3 (three) times daily as needed for muscle spasms.  Marland Kitchen levETIRAcetam (KEPPRA) 500 MG tablet Take 1 tablet (500 mg total) by mouth 2 (two) times daily.  . hydrochlorothiazide (HYDRODIURIL) 12.5 MG tablet Take 1 tablet (12.5 mg total) by mouth daily.   No facility-administered encounter medications on file as of 02/06/2020.    Surgical History: History reviewed. No pertinent surgical history.  Medical History: Past Medical History:  Diagnosis Date  . Insomnia   . Seizures ([place])     Family History: Family History  Problem Relation Age of Onset  . Hypertension Mother     Social History   Socioeconomic History  . Marital status: Married    Spouse name: Not on file  . Number of children: Not on file  . Years of education: Not on file  . Highest education level: Not on file  Occupational History  . Not on file  Tobacco Use  . Smoking status: Former Smoker    Quit date: 12/08/2017    Years since  quitting: 2.2  . Smokeless tobacco: Never Used  Vaping Use  . Vaping Use: Never used  Substance and Sexual Activity  . Alcohol use: Yes    Comment: ocassional  . Drug use: Never  . Sexual activity: Not on file  Other Topics Concern  . Not on file  Social History Narrative  . Not on file   Social Determinants of Health   Financial Resource Strain: Not on file  Food Insecurity: Not on file  Transportation Needs: Not on file  Physical Activity: Not on file  Stress: Not on file  Social Connections: Not on file  Intimate Partner Violence: Not on file      Review of Systems  Constitutional: Negative for activity change, chills, fatigue and unexpected weight change.  HENT: Negative for congestion, postnasal drip, rhinorrhea, sneezing and sore throat.   Respiratory: Negative for cough, chest tightness and shortness of breath.   Cardiovascular: Negative for chest pain and palpitations.       Blood pressure improved, but systolic pressure still elevated   Gastrointestinal: Negative for abdominal pain, constipation, diarrhea, nausea and vomiting.  Musculoskeletal: Negative for arthralgias, back pain, joint swelling and neck pain.  Skin: Negative for rash.  Neurological: Positive for seizures. Negative for tremors and numbness.       Well managed seizure disorder.   Hematological: Negative for adenopathy. Does not bruise/bleed easily.  Psychiatric/Behavioral: Negative  for behavioral problems (Depression), sleep disturbance and suicidal ideas. The patient is not nervous/anxious.     Today's Vitals   02/06/20 1125  BP: (!) 152/90  Pulse: 72  Resp: 16  Temp: (!) 97.5 F (36.4 C)  SpO2: 94%  Weight: 221 lb 9.6 oz (100.5 kg)  Height: 5\' 6"  (1.676 m)   Body mass index is 35.77 kg/m.  Physical Exam Vitals and nursing note reviewed.  Constitutional:      General: He is not in acute distress.    Appearance: Normal appearance. He is well-developed and well-nourished. He is not  diaphoretic.  HENT:     Head: Normocephalic and atraumatic.     Nose: Nose normal.     Mouth/Throat:     Mouth: Oropharynx is clear and moist.     Pharynx: No oropharyngeal exudate.  Eyes:     Extraocular Movements: EOM normal.     Pupils: Pupils are equal, round, and reactive to light.  Neck:     Thyroid: No thyromegaly.     Vascular: No JVD.     Trachea: No tracheal deviation.  Cardiovascular:     Rate and Rhythm: Normal rate and regular rhythm.     Heart sounds: Normal heart sounds. No murmur heard. No friction rub. No gallop.   Pulmonary:     Effort: Pulmonary effort is normal. No respiratory distress.     Breath sounds: Normal breath sounds. No wheezing or rales.  Chest:     Chest wall: No tenderness.  Abdominal:     Palpations: Abdomen is soft.  Musculoskeletal:        General: Normal range of motion.     Cervical back: Normal range of motion and neck supple.  Lymphadenopathy:     Cervical: No cervical adenopathy.  Skin:    General: Skin is warm and dry.  Neurological:     Mental Status: He is alert and oriented to person, place, and time. Mental status is at baseline.     Cranial Nerves: No cranial nerve deficit.  Psychiatric:        Mood and Affect: Mood and affect and mood normal.        Behavior: Behavior normal.        Thought Content: Thought content normal.        Judgment: Judgment normal.    Assessment/Plan: 1. Essential hypertension Add HCTZ 12.5mg  tablets daily. Continue amlodipine 5mg  daily. Limit sodium and increase water in diet. Monitor blood pressure closely.  - hydrochlorothiazide (HYDRODIURIL) 12.5 MG tablet; Take 1 tablet (12.5 mg total) by mouth daily.  Dispense: 30 tablet; Refill: 1  2. Seizure disorder ([place]) Stable. Continue seizure medication as prescribed  General Counseling: Casimiro Needle understanding of the findings of todays visit and agrees with plan of treatment. I have discussed any further diagnostic evaluation that may be  needed or ordered today. We also reviewed his medications today. he has been encouraged to call the office with any questions or concerns that should arise related to todays visit.   Hypertension Counseling:   The following hypertensive lifestyle modification were recommended and discussed:  1. Limiting alcohol intake to less than 1 oz/day of ethanol:(24 oz of beer or 8 oz of wine or 2 oz of 100-proof whiskey). 2. Take baby ASA 81 mg daily. 3. Importance of regular aerobic exercise and losing weight. 4. Reduce dietary saturated fat and cholesterol intake for overall cardiovascular health. 5. Maintaining adequate dietary potassium, calcium, and magnesium intake. 6. Regular  monitoring of the blood pressure. 7. Reduce sodium intake to less than 100 mmol/day (less than 2.3 gm of sodium or less than 6 gm of sodium choride)   This patient was seen by [facility] with Dr Lavera Guise as a part of collaborative care agreement  Meds ordered this encounter  Medications  . hydrochlorothiazide (HYDRODIURIL) 12.5 MG tablet    Sig: Take 1 tablet (12.5 mg total) by mouth daily.    Dispense:  30 tablet    Refill:  1    Order Specific Question:   Supervising Provider    Answer:   Lavera Guise [[id number]]    Total time spent: 25 Minutes   Time spent includes review of chart, medications, test results, and follow up plan with the patient.      Dr Lavera Guise Internal medicine

## 2020-02-08 DIAGNOSIS — I10 Essential (primary) hypertension: Secondary | ICD-10-CM | POA: Insufficient documentation

## 2020-02-08 DIAGNOSIS — G40909 Epilepsy, unspecified, not intractable, without status epilepticus: Secondary | ICD-10-CM | POA: Insufficient documentation

## 2020-02-28 ENCOUNTER — Telehealth: Payer: Self-pay

## 2020-02-28 NOTE — Telephone Encounter (Signed)
Completed medical record request and mailed requested records to [facility] [employer] [facility] [address] [place] [address]. Faxed medical record payment request to [phone].

## 2020-03-13 ENCOUNTER — Ambulatory Visit: Payer: Medicaid Other | Admitting: Nurse Practitioner

## 2020-04-01 ENCOUNTER — Ambulatory Visit (INDEPENDENT_AMBULATORY_CARE_PROVIDER_SITE_OTHER): Payer: Medicaid Other | Admitting: Hospice and Palliative Medicine

## 2020-04-01 ENCOUNTER — Encounter: Payer: Self-pay | Admitting: Hospice and Palliative Medicine

## 2020-04-01 ENCOUNTER — Other Ambulatory Visit: Payer: Self-pay

## 2020-04-01 VITALS — BP 142/80 | HR 95 | Temp 97.2°F | Resp 16 | Ht 66.0 in | Wt 222.4 lb

## 2020-04-01 DIAGNOSIS — G479 Sleep disorder, unspecified: Secondary | ICD-10-CM

## 2020-04-01 DIAGNOSIS — G40909 Epilepsy, unspecified, not intractable, without status epilepticus: Secondary | ICD-10-CM

## 2020-04-01 DIAGNOSIS — I10 Essential (primary) hypertension: Secondary | ICD-10-CM

## 2020-04-01 DIAGNOSIS — R079 Chest pain, unspecified: Secondary | ICD-10-CM

## 2020-04-01 NOTE — Progress Notes (Signed)
[facility] [place], [place] [address]  Internal MEDICINE  Office Visit Note  Patient Name: Tony Collier  [id number]  [id number]  Date of Service: 04/01/2020  Chief Complaint  Patient presents with  . Follow-up  . Quality Metric Gaps    Colonoscopy, COVID booster done will provide date next visit    HPI Patient is here for routine follow-up We have been trying to control his BP At last visit we added 12.5 HCTZ to his regimen, still taking 5 mg amlodipine  Followed by Dr. Manuella Ghazi neurology for seizures--ordered home sleep study at last visit in September, unable to see results will contact office  He reports he has been having intermittent chest pain when exerting himself, pain is a sharp shooting pain only lasts a few seconds and then resolves with rest   Current Medication: Outpatient Encounter Medications as of 04/01/2020  Medication Sig  . amLODipine (NORVASC) 5 MG tablet Take 1 tablet (5 mg total) by mouth daily.  . carbamazepine (TEGRETOL) 100 MG chewable tablet Take 4 Tablets in AM and 4 Tablets in PM  . cyclobenzaprine (FLEXERIL) 5 MG tablet Take 1 tablet (5 mg total) by mouth 3 (three) times daily as needed for muscle spasms.  . hydrochlorothiazide (HYDRODIURIL) 12.5 MG tablet Take 1 tablet (12.5 mg total) by mouth daily.  Marland Kitchen levETIRAcetam (KEPPRA) 500 MG tablet Take 1 tablet (500 mg total) by mouth 2 (two) times daily.   No facility-administered encounter medications on file as of 04/01/2020.    Surgical History: History reviewed. No pertinent surgical history.  Medical History: Past Medical History:  Diagnosis Date  . Insomnia   . Seizures ([place])     Family History: Family History  Problem Relation Age of Onset  . Hypertension Mother     Social History   Socioeconomic History  . Marital status: Married    Spouse name: Not on file  . Number of children: Not on file  . Years of education: Not on file  . Highest education  level: Not on file  Occupational History  . Not on file  Tobacco Use  . Smoking status: Former Smoker    Quit date: 12/08/2017    Years since quitting: 2.3  . Smokeless tobacco: Never Used  Vaping Use  . Vaping Use: Never used  Substance and Sexual Activity  . Alcohol use: Yes    Comment: ocassional  . Drug use: Never  . Sexual activity: Not on file  Other Topics Concern  . Not on file  Social History Narrative  . Not on file   Social Determinants of Health   Financial Resource Strain: Not on file  Food Insecurity: Not on file  Transportation Needs: Not on file  Physical Activity: Not on file  Stress: Not on file  Social Connections: Not on file  Intimate Partner Violence: Not on file      Review of Systems  Constitutional: Negative for chills, fatigue and unexpected weight change.  HENT: Negative for congestion, postnasal drip, rhinorrhea, sneezing and sore throat.   Eyes: Negative for redness.  Respiratory: Negative for cough, chest tightness and shortness of breath.   Cardiovascular: Positive for chest pain. Negative for palpitations.  Gastrointestinal: Negative for abdominal pain, constipation, diarrhea, nausea and vomiting.  Genitourinary: Negative for dysuria and frequency.  Musculoskeletal: Negative for arthralgias, back pain, joint swelling and neck pain.  Skin: Negative for rash.  Neurological: Negative for tremors and numbness.  Hematological: Negative for adenopathy. Does  not bruise/bleed easily.  Psychiatric/Behavioral: Negative for behavioral problems (Depression), sleep disturbance and suicidal ideas. The patient is not nervous/anxious.     Vital Signs: BP (!) 142/80   Pulse 95   Temp (!) 97.2 F (36.2 C)   Resp 16   Ht 5\' 6"  (1.676 m)   Wt 222 lb 6.4 oz (100.9 kg)   SpO2 98%   BMI 35.90 kg/m    Physical Exam Vitals reviewed.  Constitutional:      Appearance: Normal appearance. He is obese.  Cardiovascular:     Rate and Rhythm: Normal  rate and regular rhythm.     Pulses: Normal pulses.     Heart sounds: Normal heart sounds.  Pulmonary:     Effort: Pulmonary effort is normal.     Breath sounds: Normal breath sounds.  Abdominal:     General: Abdomen is flat.     Palpations: Abdomen is soft.  Musculoskeletal:        General: Normal range of motion.     Cervical back: Normal range of motion.  Skin:    General: Skin is warm.  Neurological:     General: No focal deficit present.     Mental Status: He is alert and oriented to person, place, and time. Mental status is at baseline.  Psychiatric:        Mood and Affect: Mood normal.        Behavior: Behavior normal.        Thought Content: Thought content normal.        Judgment: Judgment normal.    Assessment/Plan: 1. Essential hypertension Double HCTZ for total of 25 mg, will follow-up in 1 months and assess response to therapy  2. Chest pain, unspecified type EKG normal, will follow-up with echocardiogram for underlying cardiac etiology May need cardiology referral - EKG 12-Lead - ECHOCARDIOGRAM COMPLETE; Future  3. Seizure disorder ([place]) Symptoms remain stable at this time, followed by Dr. Shah-neurology  4. Sleep disturbance Will follow-up with neurology office--last ordered home sleep study, will need to review results  General Counseling: Casimiro Needle understanding of the findings of todays visit and agrees with plan of treatment. I have discussed any further diagnostic evaluation that may be needed or ordered today. We also reviewed his medications today. he has been encouraged to call the office with any questions or concerns that should arise related to todays visit.    Orders Placed This Encounter  Procedures  . EKG 12-Lead  . ECHOCARDIOGRAM COMPLETE    Time spent: 30 Minutes Time spent includes review of chart, medications, test results and follow-up plan with the patient.  This patient was seen by Theodoro Grist AGNP-C in Collaboration  with Dr Lavera Guise as a part of collaborative care agreement     Tanna Furry. Destyn Schuyler AGNP-C Internal medicine

## 2020-04-03 ENCOUNTER — Encounter: Payer: BLUE CROSS/BLUE SHIELD | Admitting: Hospice and Palliative Medicine

## 2020-04-09 ENCOUNTER — Other Ambulatory Visit: Payer: BLUE CROSS/BLUE SHIELD

## 2020-04-17 ENCOUNTER — Other Ambulatory Visit: Payer: Self-pay

## 2020-04-17 DIAGNOSIS — I10 Essential (primary) hypertension: Secondary | ICD-10-CM

## 2020-04-17 MED ORDER — AMLODIPINE BESYLATE 5 MG PO TABS: 5.0000 mg | ORAL_TABLET | Freq: Every day | ORAL | 1 refills | Status: DC

## 2020-04-22 ENCOUNTER — Other Ambulatory Visit: Payer: Self-pay

## 2020-04-22 DIAGNOSIS — I10 Essential (primary) hypertension: Secondary | ICD-10-CM

## 2020-04-22 MED ORDER — HYDROCHLOROTHIAZIDE 12.5 MG PO TABS: 12.5000 mg | ORAL_TABLET | Freq: Every day | ORAL | 1 refills | Status: DC

## 2020-05-01 ENCOUNTER — Telehealth: Payer: Self-pay

## 2020-05-01 ENCOUNTER — Encounter: Payer: BLUE CROSS/BLUE SHIELD | Admitting: Physician Assistant

## 2020-05-01 NOTE — Telephone Encounter (Signed)
BILLED PATIENT OFFICE MISSED FEE. 05/01/20

## 2020-05-26 ENCOUNTER — Other Ambulatory Visit: Payer: Self-pay

## 2020-05-26 MED ORDER — AZITHROMYCIN 250 MG PO TABS: ORAL_TABLET | ORAL | 0 refills | Status: DC

## 2020-05-26 NOTE — Telephone Encounter (Signed)
Pt called c/o having body aches, stuffiness, sneezing, headaches, cough, congestion since Friday 05/23/20 and took a covid test that came back negative.  Called to see if we could send something in to pharmacy.  Per Lovena Le sent in z-pak for 5 days.

## 2020-07-01 ENCOUNTER — Other Ambulatory Visit: Payer: Self-pay | Admitting: Hospice and Palliative Medicine

## 2020-07-01 DIAGNOSIS — I10 Essential (primary) hypertension: Secondary | ICD-10-CM

## 2020-07-24 ENCOUNTER — Ambulatory Visit (INDEPENDENT_AMBULATORY_CARE_PROVIDER_SITE_OTHER): Payer: 59 | Admitting: Nurse Practitioner

## 2020-07-24 ENCOUNTER — Ambulatory Visit: Payer: Medicaid Other | Admitting: Nurse Practitioner

## 2020-07-24 ENCOUNTER — Other Ambulatory Visit: Payer: Self-pay

## 2020-07-24 ENCOUNTER — Encounter: Payer: Self-pay | Admitting: Nurse Practitioner

## 2020-07-24 DIAGNOSIS — J329 Chronic sinusitis, unspecified: Secondary | ICD-10-CM

## 2020-07-24 DIAGNOSIS — R059 Cough, unspecified: Secondary | ICD-10-CM

## 2020-07-24 LAB — POCT INFLUENZA A/B
Influenza A, POC: NEGATIVE
Influenza B, POC: NEGATIVE

## 2020-07-24 MED ORDER — AMOXICILLIN-POT CLAVULANATE 875-125 MG PO TABS: 1.0000 | ORAL_TABLET | Freq: Two times a day (BID) | ORAL | 0 refills | Status: DC

## 2020-07-24 NOTE — Progress Notes (Signed)
[facility] [place], [place], [place] [address]  Internal MEDICINE  Office Visit Note  Patient Name: Tony Collier  [id number]  [id number]  Date of Service: 07/24/2020  Chief Complaint  Patient presents with  . Acute Visit    Neg. Home covid test, feeling weak, sinus pressure, short of breath, started about a week ago, cough, chest tight, body aches, fatigue, tired, no energy, ears feel clogged up      HPI Pt is here for a sick visit.  Was treated for rhinosinusitis in early April 2022 with cefdinir. Symptoms resolved completely.  -negative home COVID test, negative for influenza A/B. -feeling weak, sinus pressure, short of breath, started about a week ago, cough, chest tight, body aches, fatigue, tired, no energy, ears feel clogged up     Current Medication:  Outpatient Encounter Medications as of 07/24/2020  Medication Sig  . amLODipine (NORVASC) 5 MG tablet Take 1 tablet (5 mg total) by mouth daily.  Marland Kitchen amoxicillin-clavulanate (AUGMENTIN) 875-125 MG tablet Take 1 tablet by mouth 2 (two) times daily.  . carbamazepine (TEGRETOL) 100 MG chewable tablet Take 4 Tablets in AM and 4 Tablets in PM  . hydrochlorothiazide (HYDRODIURIL) 12.5 MG tablet TAKE ONE TABLET BY MOUTH DAILY  . [DISCONTINUED] cyclobenzaprine (FLEXERIL) 5 MG tablet Take 1 tablet (5 mg total) by mouth 3 (three) times daily as needed for muscle spasms.  Marland Kitchen levETIRAcetam (KEPPRA) 500 MG tablet Take 1 tablet (500 mg total) by mouth 2 (two) times daily.  . [DISCONTINUED] azithromycin (ZITHROMAX) 250 MG tablet Take as directed for 5 days.  2 tablets on first day and then 1 tablet every day for next 4 days (Patient not taking: Reported on 07/24/2020)   No facility-administered encounter medications on file as of 07/24/2020.      Medical History: Past Medical History:  Diagnosis Date  . Insomnia   . Seizures ([facility])      Vital Signs: BP 122/77   Pulse 66   Temp (!) 97.1 F (36.2 C)   Resp 16    Ht 5\' 6"  (1.676 m)   Wt 218 lb 12.8 oz (99.2 kg)   SpO2 96%   BMI 35.32 kg/m    Review of Systems  Constitutional: Positive for appetite change, chills and fatigue. Negative for fever.  HENT: Positive for congestion, drooling, ear pain, postnasal drip, rhinorrhea, sinus pressure, sinus pain, sneezing, sore throat and trouble swallowing. Negative for ear discharge.        Muffled hearing/ear fullnes  Eyes: Negative.   Respiratory: Positive for cough, chest tightness, shortness of breath and wheezing.   Cardiovascular: Negative.   Gastrointestinal: Positive for diarrhea.       Diarrhea x2 days  Musculoskeletal: Positive for myalgias.  Skin: Negative for rash.  Neurological: Negative for headaches.    Physical Exam Constitutional:      Appearance: He is ill-appearing.  HENT:     Head: Normocephalic and atraumatic.     Ears:     Comments: Ear canals erythematous bilaterally    Nose: Congestion and rhinorrhea present.     Mouth/Throat:     Pharynx: Posterior oropharyngeal erythema present.  Eyes:     Extraocular Movements: Extraocular movements intact.     Conjunctiva/sclera: Conjunctivae normal.     Pupils: Pupils are equal, round, and reactive to light.  Cardiovascular:     Rate and Rhythm: Normal rate and regular rhythm.     Pulses: Normal pulses.     Heart sounds:  Normal heart sounds.  Pulmonary:     Effort: Pulmonary effort is normal.     Breath sounds: Normal breath sounds.  Lymphadenopathy:     Cervical: Cervical adenopathy present.  Skin:    General: Skin is warm and dry.     Capillary Refill: Capillary refill takes less than 2 seconds.  Neurological:     Mental Status: He is alert and oriented to person, place, and time.       Assessment/Plan: 1. Recurrent rhinosinusitis Negative for COVID-19 and Influenza A/B. Amoxicillin-clavulanate prescribed BID x10 days to treat recurrent rhinosinusitis. No follow up needed with improvement.  - POCT Influenza  A/B - amoxicillin-clavulanate (AUGMENTIN) 875-125 MG tablet; Take 1 tablet by mouth 2 (two) times daily.  Dispense: 20 tablet; Refill: 0    General Counseling: Devron verbalizes understanding of the findings of todays visit and agrees with plan of treatment. I have discussed any further diagnostic evaluation that may be needed or ordered today. We also reviewed his medications today. he has been encouraged to call the office with any questions or concerns that should arise related to todays visit.    Counseling:    Orders Placed This Encounter  Procedures  . POCT Influenza A/B    Meds ordered this encounter  Medications  . amoxicillin-clavulanate (AUGMENTIN) 875-125 MG tablet    Sig: Take 1 tablet by mouth 2 (two) times daily.    Dispense:  20 tablet    Refill:  0    [place] Controlled Substance Database was reviewed by me.  Time spent: 20 Minutes  Return if symptoms worsen or fail to improve. within 3 days of starting the antibiotic. If improving, complete 10 day antibiotic course, no need for follow-up after completing the 10 day antibiotic course and resolution of symptoms.

## 2020-07-25 ENCOUNTER — Other Ambulatory Visit: Payer: Self-pay | Admitting: Hospice and Palliative Medicine

## 2020-07-25 DIAGNOSIS — I10 Essential (primary) hypertension: Secondary | ICD-10-CM

## 2020-07-28 ENCOUNTER — Other Ambulatory Visit: Payer: Self-pay

## 2020-07-28 DIAGNOSIS — I10 Essential (primary) hypertension: Secondary | ICD-10-CM

## 2020-07-28 MED ORDER — AMLODIPINE BESYLATE 5 MG PO TABS: 1.0000 | ORAL_TABLET | Freq: Every day | ORAL | 0 refills | Status: DC

## 2020-07-29 ENCOUNTER — Telehealth: Payer: Self-pay

## 2020-07-29 NOTE — Telephone Encounter (Signed)
Lvm for patient to return my call to schedule physical.-Toni

## 2020-08-07 ENCOUNTER — Other Ambulatory Visit: Payer: Self-pay

## 2020-08-07 ENCOUNTER — Ambulatory Visit (INDEPENDENT_AMBULATORY_CARE_PROVIDER_SITE_OTHER): Payer: 59 | Admitting: Nurse Practitioner

## 2020-08-07 ENCOUNTER — Encounter: Payer: Self-pay | Admitting: Nurse Practitioner

## 2020-08-07 DIAGNOSIS — I10 Essential (primary) hypertension: Secondary | ICD-10-CM

## 2020-08-07 DIAGNOSIS — G40909 Epilepsy, unspecified, not intractable, without status epilepticus: Secondary | ICD-10-CM

## 2020-08-07 DIAGNOSIS — R413 Other amnesia: Secondary | ICD-10-CM | POA: Diagnosis not present

## 2020-08-07 DIAGNOSIS — G479 Sleep disorder, unspecified: Secondary | ICD-10-CM

## 2020-08-07 DIAGNOSIS — Z1211 Encounter for screening for malignant neoplasm of colon: Secondary | ICD-10-CM

## 2020-08-07 DIAGNOSIS — Z0001 Encounter for general adult medical examination with abnormal findings: Secondary | ICD-10-CM

## 2020-08-07 DIAGNOSIS — Z1212 Encounter for screening for malignant neoplasm of rectum: Secondary | ICD-10-CM

## 2020-08-07 DIAGNOSIS — E559 Vitamin D deficiency, unspecified: Secondary | ICD-10-CM

## 2020-08-07 DIAGNOSIS — R300 Dysuria: Secondary | ICD-10-CM

## 2020-08-07 MED ORDER — CARBAMAZEPINE 100 MG PO CHEW
CHEWABLE_TABLET | ORAL | 3 refills | Status: DC
Start: 2020-08-07 — End: 2020-11-09

## 2020-08-07 MED ORDER — HYDROCHLOROTHIAZIDE 25 MG PO TABS: 25.0000 mg | ORAL_TABLET | Freq: Every day | ORAL | 3 refills | Status: DC

## 2020-08-07 MED ORDER — TRAZODONE HCL 100 MG PO TABS: 100.0000 mg | ORAL_TABLET | Freq: Every evening | ORAL | 1 refills | Status: DC | PRN

## 2020-08-07 NOTE — Progress Notes (Signed)
[facility] [place], [place] [address]  Internal MEDICINE  Office Visit Note  Patient Name: Tony Collier  [id number]  [id number]  Date of Service: 08/10/2020  Chief Complaint  Patient presents with  . Annual Exam    Review echo, pt wants meds to help with sleep, difficult to stay asleep, has been going on for a couple months   . Quality Metric Gaps    Colonoscopy     HPI Tony Collier presents for an annual physical today. Tony Collier was recently seen and treated for a sinus infection which Tony Collier reports is resolving and Tony Collier is finishing the course of antibiotics. Tony Collier is accompanied by his wife to today's office visit. Tony Collier has a history of seizures and is followed neurology. Tony Collier is taking carbamazepine and keppra to control his seizures. Tony Collier is having another problem now with his memory. Tony Collier said it has actually been a problem for a while. Tony Collier reports not being able to remember something his wife told him 5 minutes ago and this happens frequently. Tony Collier also reports often getting lost when going to familiar places and having increasing forgetfulness.  -An echocardiogram was previously ordered but has not been done.  -having difficulty staying sleep, would like to try medication for sleep.  Screening for colorectal cancer due. Tony Collier has no family history of colon or colorectal cancer but does not want to do the cologard stool test. Patient is requesting GI referral for colonoscopy.  -routine labs have not been drawn since last year 2021. Routine labs will be ordered.    Current Medication: Outpatient Encounter Medications as of 08/07/2020  Medication Sig  . amLODipine (NORVASC) 5 MG tablet Take 1 tablet (5 mg total) by mouth daily.  Marland Kitchen amoxicillin-clavulanate (AUGMENTIN) 875-125 MG tablet Take 1 tablet by mouth 2 (two) times daily.  Marland Kitchen azelastine (ASTELIN) 0.1 % nasal spray Place into the nose.  . hydrochlorothiazide (HYDRODIURIL) 25 MG tablet Take 1 tablet (25 mg total) by mouth daily.  .  traZODone (DESYREL) 100 MG tablet Take 1 tablet (100 mg total) by mouth at bedtime as needed for sleep.  . [DISCONTINUED] carbamazepine (TEGRETOL) 100 MG chewable tablet Take 4 Tablets in AM and 4 Tablets in PM  . [DISCONTINUED] hydrochlorothiazide (HYDRODIURIL) 12.5 MG tablet TAKE ONE TABLET BY MOUTH DAILY  . carbamazepine (TEGRETOL) 100 MG chewable tablet Take 4 Tablets in AM and 4 Tablets in PM  . levETIRAcetam (KEPPRA) 500 MG tablet Take 1 tablet (500 mg total) by mouth 2 (two) times daily.   No facility-administered encounter medications on file as of 08/07/2020.    Surgical History: History reviewed. No pertinent surgical history.  Medical History: Past Medical History:  Diagnosis Date  . Insomnia   . Seizures ([place])     Family History: Family History  Problem Relation Age of Onset  . Hypertension Mother     Social History   Socioeconomic History  . Marital status: Married    Spouse name: Not on file  . Number of children: Not on file  . Years of education: Not on file  . Highest education level: Not on file  Occupational History  . Not on file  Tobacco Use  . Smoking status: Former Smoker    Quit date: 12/08/2017    Years since quitting: 2.6  . Smokeless tobacco: Never Used  Vaping Use  . Vaping Use: Never used  Substance and Sexual Activity  . Alcohol use: Yes    Comment: ocassional  .  Drug use: Never  . Sexual activity: Not on file  Other Topics Concern  . Not on file  Social History Narrative  . Not on file   Social Determinants of Health   Financial Resource Strain: Not on file  Food Insecurity: Not on file  Transportation Needs: Not on file  Physical Activity: Not on file  Stress: Not on file  Social Connections: Not on file  Intimate Partner Violence: Not on file      Review of Systems  Constitutional: Negative for chills, fatigue, fever and unexpected weight change.  HENT: Positive for congestion. Negative for rhinorrhea, sneezing and sore  throat.   Eyes: Negative for redness.  Respiratory: Negative.  Negative for cough, chest tightness and shortness of breath.   Cardiovascular: Negative.  Negative for chest pain and palpitations.  Gastrointestinal: Negative.  Negative for abdominal pain, constipation, diarrhea, nausea and vomiting.  Endocrine: Negative.   Genitourinary: Negative.  Negative for dysuria and frequency.  Musculoskeletal: Negative for arthralgias, back pain, joint swelling and neck pain.  Skin: Negative for rash.  Neurological: Negative.  Negative for tremors and numbness.  Hematological: Negative for adenopathy. Does not bruise/bleed easily.  Psychiatric/Behavioral: Negative for behavioral problems (Depression), sleep disturbance and suicidal ideas. The patient is not nervous/anxious.     Vital Signs: BP (!) 148/92   Pulse 88   Temp (!) 97.4 F (36.3 C)   Resp 16   Ht 5\' 6"  (1.676 m)   Wt 224 lb 6.4 oz (101.8 kg)   SpO2 97%   BMI 36.22 kg/m    Physical Exam Vitals reviewed.  Constitutional:      General: Tony Collier is not in acute distress.    Appearance: Normal appearance. Tony Collier is well-developed and well-groomed. Tony Collier is morbidly obese. Tony Collier is not diaphoretic.  HENT:     Head: Normocephalic and atraumatic.     Right Ear: Tympanic membrane, ear canal and external ear normal.     Left Ear: Tympanic membrane, ear canal and external ear normal.     Nose: Nose normal.     Mouth/Throat:     Mouth: Mucous membranes are moist.     Pharynx: Oropharynx is clear. No oropharyngeal exudate.  Eyes:     Extraocular Movements: Extraocular movements intact.     Conjunctiva/sclera: Conjunctivae normal.     Pupils: Pupils are equal, round, and reactive to light.  Neck:     Thyroid: No thyromegaly.     Vascular: No JVD.     Trachea: No tracheal deviation.  Cardiovascular:     Rate and Rhythm: Normal rate and regular rhythm.     Pulses: Normal pulses.     Heart sounds: Normal heart sounds. No murmur heard. No friction  rub. No gallop.   Pulmonary:     Effort: Pulmonary effort is normal. No respiratory distress.     Breath sounds: Normal breath sounds. No wheezing or rales.  Chest:     Chest wall: No tenderness.  Abdominal:     General: Bowel sounds are normal. There is no distension.     Palpations: Abdomen is soft. There is no mass.     Tenderness: There is no abdominal tenderness. There is no guarding or rebound.     Hernia: No hernia is present.  Musculoskeletal:        General: Normal range of motion.     Cervical back: Normal range of motion and neck supple.  Lymphadenopathy:     Cervical: No cervical adenopathy.  Skin:    General: Skin is warm and dry.     Capillary Refill: Capillary refill takes less than 2 seconds.  Neurological:     Mental Status: Tony Collier is alert and oriented to person, place, and time.  Psychiatric:        Mood and Affect: Mood normal.        Behavior: Behavior normal. Behavior is cooperative.        Thought Content: Thought content normal.        Judgment: Judgment normal.    Assessment/Plan: 1. Encounter for routine adult health examination with abnormal findings Age-appropriate preventive screenings discussed, annual physical exam completed. Routine labs for health maintenance ordered - azelastine (ASTELIN) 0.1 % nasal spray; Place into the nose. - CBC with Differential/Platelet - Lipid Profile - TSH + free T4 - B12 and Folate Panel - Comprehensive metabolic panel  2. Memory deficit Impaired short term memory, difficulty remembering how to get to familiar places, increasing forgetfulness. Recommended to patient that Tony Collier should call his neurologist and make an appointment to discuss the memory deficit. The patient and his wife agreed that it would be helpful to discuss this problem with the neurologist.   3. Essential hypertension Blood pressure 148/92. Hydrochlorothiazide dose increased.follow up in 4 weeks to evaluate blood pressure.  - hydrochlorothiazide  (HYDRODIURIL) 25 MG tablet; Take 1 tablet (25 mg total) by mouth daily.  Dispense: 90 tablet; Refill: 3  4. Seizure disorder [facility]) Managed by neurology. Refill of carbamazepine ordered.  - carbamazepine (TEGRETOL) 100 MG chewable tablet; Take 4 Tablets in AM and 4 Tablets in PM  Dispense: 240 tablet; Refill: 3  5. Sleep disturbance Patient is having difficulty sleeping, especially staying asleep. Trazodone prescribed. Follow up in 4 weeks.  - traZODone (DESYREL) 100 MG tablet; Take 1 tablet (100 mg total) by mouth at bedtime as needed for sleep.  Dispense: 90 tablet; Refill: 1  6. Screening for colorectal cancer Due for age-appropriate colorectal cancer screening. No family history of colorectal cancer. Requested colonoscopy, declined cologard. GI referral placed.  - Ambulatory referral to Gastroenterology  7. Vitamin D deficiency At risk for vitamin D deficiency, lab ordered to check level.  - Vitamin D (25 hydroxy)  8. Dysuria Routine urinalysis and culure collected. - UA/M w/rflx Culture, Routine - Microscopic Examination   General Counseling: Valdez verbalizes understanding of the findings of todays visit and agrees with plan of treatment. I have discussed any further diagnostic evaluation that may be needed or ordered today. We also reviewed his medications today. Tony Collier has been encouraged to call the office with any questions or concerns that should arise related to todays visit.    Orders Placed This Encounter  Procedures  . Microscopic Examination  . UA/M w/rflx Culture, Routine  . CBC with Differential/Platelet  . Lipid Profile  . TSH + free T4  . Vitamin D (25 hydroxy)  . B12 and Folate Panel  . Comprehensive metabolic panel  . Ambulatory referral to Gastroenterology    Meds ordered this encounter  Medications  . carbamazepine (TEGRETOL) 100 MG chewable tablet    Sig: Take 4 Tablets in AM and 4 Tablets in PM    Dispense:  240 tablet    Refill:  3  . traZODone  (DESYREL) 100 MG tablet    Sig: Take 1 tablet (100 mg total) by mouth at bedtime as needed for sleep.    Dispense:  90 tablet    Refill:  1  . hydrochlorothiazide (HYDRODIURIL)  25 MG tablet    Sig: Take 1 tablet (25 mg total) by mouth daily.    Dispense:  90 tablet    Refill:  3   Return in about 4 weeks (around 09/04/2020) for F/U, med refill blood pressure check, Esther Bradstreet PCP.  Total time spent: 30 Minutes Time spent includes review of chart, medications, test results, and follow up plan with the patient.   [place] Controlled Substance Database was reviewed by me.  This patient was seen by Jonetta Osgood, FNP-C in collaboration with Dr. Clayborn Bigness as a part of collaborative care agreement.  Jonetta Osgood, MSN, FNP-C Internal medicine

## 2020-08-08 LAB — UA/M W/RFLX CULTURE, ROUTINE
Bilirubin, UA: NEGATIVE
Glucose, UA: NEGATIVE
Ketones, UA: NEGATIVE
Leukocytes,UA: NEGATIVE
Nitrite, UA: NEGATIVE
Protein,UA: NEGATIVE
RBC, UA: NEGATIVE
Specific Gravity, UA: 1.016 (ref 1.005–1.030)
Urobilinogen, Ur: 0.2 mg/dL (ref 0.2–1.0)
pH, UA: 5.5 (ref 5.0–7.5)

## 2020-08-08 LAB — MICROSCOPIC EXAMINATION
Bacteria, UA: NONE SEEN
Casts: NONE SEEN /LPF
Epithelial Cells (non renal): NONE SEEN /HPF (ref 0–10)
RBC, Urine: NONE SEEN /HPF (ref 0–2)
WBC, UA: NONE SEEN /HPF (ref 0–5)

## 2020-08-10 DIAGNOSIS — G479 Sleep disorder, unspecified: Secondary | ICD-10-CM | POA: Insufficient documentation

## 2020-08-14 ENCOUNTER — Telehealth: Payer: Self-pay

## 2020-08-14 ENCOUNTER — Other Ambulatory Visit: Payer: Self-pay

## 2020-08-14 MED ORDER — SUPREP BOWEL PREP KIT 17.5-3.13-1.6 GM/177ML PO SOLN: 1.0000 | ORAL | 0 refills | Status: DC

## 2020-08-14 NOTE — Telephone Encounter (Signed)
Gastroenterology Pre-Procedure Review  Request Date: 09/12/20 Requesting Physician: Dr. Vicente Males  PATIENT REVIEW QUESTIONS: The patient responded to the following health history questions as indicated:    1. Are you having any GI issues? no 2. Do you have a personal history of Polyps? no 3. Do you have a family history of Colon Cancer or Polyps? no 4. Diabetes Mellitus? no 5. Joint replacements in the past 12 months?no 6. Major health problems in the past 3 months?no 7. Any artificial heart valves, MVP, or defibrillator?no    MEDICATIONS & ALLERGIES:    Patient reports the following regarding taking any anticoagulation/antiplatelet therapy:   Plavix, Coumadin, Eliquis, Xarelto, Lovenox, Pradaxa, Brilinta, or Effient? no Aspirin? no  Patient confirms/reports the following medications:  Current Outpatient Medications  Medication Sig Dispense Refill  . amLODipine (NORVASC) 5 MG tablet Take 1 tablet (5 mg total) by mouth daily. 30 tablet 0  . amoxicillin-clavulanate (AUGMENTIN) 875-125 MG tablet Take 1 tablet by mouth 2 (two) times daily. 20 tablet 0  . azelastine (ASTELIN) 0.1 % nasal spray Place into the nose.    . carbamazepine (TEGRETOL) 100 MG chewable tablet Take 4 Tablets in AM and 4 Tablets in PM 240 tablet 3  . hydrochlorothiazide (HYDRODIURIL) 25 MG tablet Take 1 tablet (25 mg total) by mouth daily. 90 tablet 3  . levETIRAcetam (KEPPRA) 500 MG tablet Take 1 tablet (500 mg total) by mouth 2 (two) times daily. 60 tablet 3  . Na Sulfate-K Sulfate-Mg Sulf (SUPREP BOWEL PREP KIT) 17.5-3.13-1.6 GM/177ML SOLN Take 1 kit by mouth as directed. 354 mL 0  . traZODone (DESYREL) 100 MG tablet Take 1 tablet (100 mg total) by mouth at bedtime as needed for sleep. 90 tablet 1   No current facility-administered medications for this visit.    Patient confirms/reports the following allergies:  Allergies  Allergen Reactions  . Tylenol [Acetaminophen]     Pt gets pink eye    No orders of the  defined types were placed in this encounter.   AUTHORIZATION INFORMATION Primary Insurance: 1D#: Group #:  Secondary Insurance: 1D#: Group #:  SCHEDULE INFORMATION: Date:  Time: Location:

## 2020-08-15 ENCOUNTER — Other Ambulatory Visit: Payer: Self-pay

## 2020-08-15 DIAGNOSIS — E559 Vitamin D deficiency, unspecified: Secondary | ICD-10-CM

## 2020-08-15 DIAGNOSIS — E039 Hypothyroidism, unspecified: Secondary | ICD-10-CM

## 2020-08-15 DIAGNOSIS — Z0001 Encounter for general adult medical examination with abnormal findings: Secondary | ICD-10-CM

## 2020-08-19 LAB — COMPREHENSIVE METABOLIC PANEL
ALT: 24 IU/L (ref 0–44)
AST: 16 IU/L (ref 0–40)
Albumin/Globulin Ratio: 1.5 (ref 1.2–2.2)
Albumin: 4.8 g/dL (ref 4.0–5.0)
Alkaline Phosphatase: 82 IU/L (ref 44–121)
BUN/Creatinine Ratio: 11 (ref 9–20)
BUN: 12 mg/dL (ref 6–24)
Bilirubin Total: 0.2 mg/dL (ref 0.0–1.2)
CO2: 21 mmol/L (ref 20–29)
Calcium: 9.5 mg/dL (ref 8.7–10.2)
Chloride: 103 mmol/L (ref 96–106)
Creatinine, Ser: 1.05 mg/dL (ref 0.76–1.27)
Globulin, Total: 3.3 g/dL (ref 1.5–4.5)
Glucose: 99 mg/dL (ref 65–99)
Potassium: 3.7 mmol/L (ref 3.5–5.2)
Sodium: 142 mmol/L (ref 134–144)
Total Protein: 8.1 g/dL (ref 6.0–8.5)
eGFR: 89 mL/min/{1.73_m2} (ref >59)

## 2020-08-19 LAB — CBC WITH DIFFERENTIAL/PLATELET
Basophils Absolute: 0 10*3/uL (ref 0.0–0.2)
Basos: 0 %
EOS (ABSOLUTE): 0.1 10*3/uL (ref 0.0–0.4)
Eos: 2 %
Hematocrit: 44.9 % (ref 37.5–51.0)
Hemoglobin: 15.4 g/dL (ref 13.0–17.7)
Immature Grans (Abs): 0 10*3/uL (ref 0.0–0.1)
Immature Granulocytes: 0 %
Lymphocytes Absolute: 1.6 10*3/uL (ref 0.7–3.1)
Lymphs: 29 %
MCH: 32.1 pg (ref 26.6–33.0)
MCHC: 34.3 g/dL (ref 31.5–35.7)
MCV: 94 fL (ref 79–97)
Monocytes Absolute: 0.4 10*3/uL (ref 0.1–0.9)
Monocytes: 7 %
Neutrophils Absolute: 3.4 10*3/uL (ref 1.4–7.0)
Neutrophils: 62 %
Platelets: 240 10*3/uL (ref 150–450)
RBC: 4.8 x10E6/uL (ref 4.14–5.80)
RDW: 13 % (ref 11.6–15.4)
WBC: 5.6 10*3/uL (ref 3.4–10.8)

## 2020-08-19 LAB — LIPID PANEL WITH LDL/HDL RATIO
Cholesterol, Total: 191 mg/dL (ref 100–199)
HDL: 35 mg/dL — ABNORMAL LOW (ref >39)
LDL Chol Calc (NIH): 120 mg/dL — ABNORMAL HIGH (ref 0–99)
LDL/HDL Ratio: 3.4 ratio (ref 0.0–3.6)
Triglycerides: 201 mg/dL — ABNORMAL HIGH (ref 0–149)
VLDL Cholesterol Cal: 36 mg/dL (ref 5–40)

## 2020-08-19 LAB — B12 AND FOLATE PANEL
Folate: 7.9 ng/mL (ref >3.0)
Vitamin B-12: 507 pg/mL (ref 232–1245)

## 2020-08-19 LAB — VITAMIN D 25 HYDROXY (VIT D DEFICIENCY, FRACTURES): Vit D, 25-Hydroxy: 21.3 ng/mL — ABNORMAL LOW (ref 30.0–100.0)

## 2020-08-19 LAB — TSH+FREE T4
Free T4: 1 ng/dL (ref 0.82–1.77)
TSH: 0.656 u[IU]/mL (ref 0.450–4.500)

## 2020-09-03 ENCOUNTER — Ambulatory Visit: Payer: 59 | Admitting: Nurse Practitioner

## 2020-09-09 ENCOUNTER — Telehealth: Payer: Self-pay

## 2020-09-09 NOTE — Telephone Encounter (Signed)
Rx for Suprep was sent 08/14/2020 and I spoke to Tony Collier, they confirmed Rx was received and they will get it ready showing $0 co pay

## 2020-09-09 NOTE — Telephone Encounter (Signed)
Patient has colonoscopy schedule for Friday. His wife states no prep has been called in for him and she would like it called in to [facility]. Please inform patient when it is done

## 2020-09-11 ENCOUNTER — Encounter: Payer: Self-pay | Admitting: Gastroenterology

## 2020-09-11 ENCOUNTER — Ambulatory Visit: Payer: 59 | Admitting: Physician Assistant

## 2020-09-12 ENCOUNTER — Ambulatory Visit: Payer: 59 | Admitting: Anesthesiology

## 2020-09-12 ENCOUNTER — Encounter
Admission: RE | Disposition: A | Discharge status: Home / Self Care | Payer: Self-pay | Source: Home / Self Care | Attending: Gastroenterology

## 2020-09-12 ENCOUNTER — Encounter: Payer: Self-pay | Admitting: Gastroenterology

## 2020-09-12 ENCOUNTER — Ambulatory Visit
Admission: RE | Admit: 2020-09-12 | Discharge: 2020-09-12 | Disposition: A | Discharge status: Home / Self Care | Payer: 59 | Attending: Gastroenterology | Admitting: Gastroenterology

## 2020-09-12 DIAGNOSIS — Z87891 Personal history of nicotine dependence: Secondary | ICD-10-CM | POA: Diagnosis not present

## 2020-09-12 DIAGNOSIS — Z79899 Other long term (current) drug therapy: Secondary | ICD-10-CM | POA: Insufficient documentation

## 2020-09-12 DIAGNOSIS — K635 Polyp of colon: Secondary | ICD-10-CM

## 2020-09-12 DIAGNOSIS — K6389 Other specified diseases of intestine: Secondary | ICD-10-CM | POA: Insufficient documentation

## 2020-09-12 DIAGNOSIS — K621 Rectal polyp: Secondary | ICD-10-CM

## 2020-09-12 DIAGNOSIS — K639 Disease of intestine, unspecified: Secondary | ICD-10-CM

## 2020-09-12 DIAGNOSIS — Z886 Allergy status to analgesic agent status: Secondary | ICD-10-CM | POA: Insufficient documentation

## 2020-09-12 DIAGNOSIS — K5730 Diverticulosis of large intestine without perforation or abscess without bleeding: Secondary | ICD-10-CM | POA: Insufficient documentation

## 2020-09-12 DIAGNOSIS — D123 Benign neoplasm of transverse colon: Secondary | ICD-10-CM | POA: Insufficient documentation

## 2020-09-12 DIAGNOSIS — I10 Essential (primary) hypertension: Secondary | ICD-10-CM | POA: Insufficient documentation

## 2020-09-12 DIAGNOSIS — Z8249 Family history of ischemic heart disease and other diseases of the circulatory system: Secondary | ICD-10-CM | POA: Diagnosis not present

## 2020-09-12 DIAGNOSIS — Z1211 Encounter for screening for malignant neoplasm of colon: Secondary | ICD-10-CM | POA: Diagnosis present

## 2020-09-12 HISTORY — PX: COLONOSCOPY: SHX5424

## 2020-09-12 HISTORY — DX: Essential (primary) hypertension: I10

## 2020-09-12 SURGERY — COLONOSCOPY
Anesthesia: General

## 2020-09-12 MED ORDER — PROPOFOL 500 MG/50ML IV EMUL
INTRAVENOUS | Status: DC | PRN
  Administered 2020-09-12: 180 ug/kg/min via INTRAVENOUS

## 2020-09-12 MED ORDER — PROPOFOL 10 MG/ML IV BOLUS
INTRAVENOUS | Status: DC | PRN
  Administered 2020-09-12: 50 mg via INTRAVENOUS
  Administered 2020-09-12: 40 mg via INTRAVENOUS

## 2020-09-12 MED ORDER — EPHEDRINE SULFATE 50 MG/ML IJ SOLN
INTRAMUSCULAR | Status: DC | PRN
  Administered 2020-09-12: 15 mg via INTRAVENOUS

## 2020-09-12 MED ORDER — PROPOFOL 500 MG/50ML IV EMUL
INTRAVENOUS | Status: AC
  Filled 2020-09-12: qty 50

## 2020-09-12 MED ORDER — PHENYLEPHRINE HCL (PRESSORS) 10 MG/ML IV SOLN
INTRAVENOUS | Status: DC | PRN
  Administered 2020-09-12: 80 ug via INTRAVENOUS
  Administered 2020-09-12: 120 ug via INTRAVENOUS

## 2020-09-12 MED ORDER — LIDOCAINE HCL (CARDIAC) PF 100 MG/5ML IV SOSY
PREFILLED_SYRINGE | INTRAVENOUS | Status: DC | PRN
  Administered 2020-09-12: 100 mg via INTRATRACHEAL

## 2020-09-12 MED ORDER — SODIUM CHLORIDE 0.9 % IV SOLN: INTRAVENOUS | Status: DC

## 2020-09-12 MED ORDER — PHENYLEPHRINE HCL (PRESSORS) 10 MG/ML IV SOLN
INTRAVENOUS | Status: AC
  Filled 2020-09-12: qty 1

## 2020-09-12 NOTE — Anesthesia Postprocedure Evaluation (Signed)
Anesthesia Post Note  Patient: Tony Collier  Procedure(s) Performed: COLONOSCOPY  Patient location during evaluation: Endoscopy Anesthesia Type: General Level of consciousness: awake and alert Pain management: pain level controlled Vital Signs Assessment: post-procedure vital signs reviewed and stable Respiratory status: spontaneous breathing, nonlabored ventilation, respiratory function stable and patient connected to nasal cannula oxygen Cardiovascular status: blood pressure returned to baseline and stable Postop Assessment: no apparent nausea or vomiting Anesthetic complications: no   No notable events documented.   Last Vitals:  Vitals:   09/12/20 0910 09/12/20 0920  BP: 120/81 118/79  Pulse: 83 80  Resp: 20 15  Temp:    SpO2: 95% 96%    Last Pain:  Vitals:   09/12/20 0725  TempSrc: Temporal  PainSc: 0-No pain                 Martha Clan

## 2020-09-12 NOTE — Transfer of Care (Signed)
Immediate Anesthesia Transfer of Care Note  Patient: Tony Collier  Procedure(s) Performed: COLONOSCOPY  Patient Location: PACU  Anesthesia Type:General  Level of Consciousness: awake  Airway & Oxygen Therapy: Patient Spontanous Breathing and Patient connected to nasal cannula oxygen  Post-op Assessment: Report given to RN and Post -op Vital signs reviewed and stable  Post vital signs: Reviewed and stable  Last Vitals:  Vitals Value Taken Time  BP 120/81 09/12/20 0908  Temp    Pulse 84 09/12/20 0912  Resp 18 09/12/20 0912  SpO2 97 % 09/12/20 0912  Vitals shown include unvalidated device data.  Last Pain:  Vitals:   09/12/20 0725  TempSrc: Temporal  PainSc: 0-No pain         Complications: No notable events documented.

## 2020-09-12 NOTE — Op Note (Signed)
[facility] Patient Name: Tony Collier Procedure Date: 09/12/2020 8:06 AM MRN: [id number] Account #: [id number] Date of Birth: 1975/10/16 Admit Type: Outpatient Age: 45 Room: [facility] ENDO ROOM 4 Gender: Male Note Status: Finalized Procedure:             Colonoscopy Indications:           Screening for colorectal malignant neoplasm Providers:             Unice Vantassel B. Bonna Gains MD, MD Medicines:             Monitored Anesthesia Care Complications:         No immediate complications. Procedure:             Pre-Anesthesia Assessment:                        - ASA Grade Assessment: II - A patient with mild                         systemic disease.                        - Prior to the procedure, a History and Physical was                         performed, and patient medications, allergies and                         sensitivities were reviewed. The patient's tolerance                         of previous anesthesia was reviewed.                        - The risks and benefits of the procedure and the                         sedation options and risks were discussed with the                         patient. All questions were answered and informed                         consent was obtained.                        - Patient identification and proposed procedure were                         verified prior to the procedure by the physician, the                         nurse, the anesthesiologist, the anesthetist and the                         technician. The procedure was verified in the                         procedure room.  After obtaining informed consent, the colonoscope was                         passed under direct vision. Throughout the procedure,                         the patient's blood pressure, pulse, and oxygen                         saturations were monitored continuously. The                         Colonoscope was  introduced through the anus and                         advanced to the the cecum, identified by appendiceal                         orifice and ileocecal valve. The colonoscopy was                         performed with ease. The patient tolerated the                         procedure well. The quality of the bowel preparation                         was good. Except for areas with corn and vegetable                         matter that was only partially cleared (see images) Findings:      The perianal and digital rectal examinations were normal.      A 8 mm polyp was found in the transverse colon. The polyp was sessile.       Imaging was performed using white light and narrow band imaging to       visualize the mucosa. Evaluation with NBI showed adenomatous appearing       mucosa and this polyp did not appear to be an inverted tic. The polyp       was removed with a cold snare. Resection and retrieval were complete.      An area of moderately erythematous mucosa was found at 60 cm proximal to       the anus. Biopsies were taken with a cold forceps for histology. Imaging       was performed using white light and narrow band imaging to visualize the       mucosa.      A 4 mm polyp was found in the rectum. The polyp was flat. The polyp was       removed with a cold biopsy forceps. Resection and retrieval were       complete. Few other hyperplastic appearing polyps were seen in the area       and the largest one was removed as a representative sample.      Multiple diverticula were found in the sigmoid colon.      The exam was otherwise without abnormality.      The rectum, sigmoid colon, descending colon, transverse colon, ascending       colon and  cecum appeared normal.      The retroflexed view of the distal rectum and anal verge was normal and       showed no anal or rectal abnormalities. Impression:            - One 8 mm polyp in the transverse colon, removed with                          a cold snare. Resected and retrieved.                        - Erythematous mucosa at 60 cm proximal to the anus.                         Biopsied.                        - One 4 mm polyp in the rectum, removed with a cold                         biopsy forceps. Resected and retrieved.                        - Diverticulosis in the sigmoid colon.                        - The examination was otherwise normal.                        - The rectum, sigmoid colon, descending colon,                         transverse colon, ascending colon and cecum are normal.                        - The distal rectum and anal verge are normal on                         retroflexion view. Recommendation:        - Await pathology results.                        - If biopsies from the area of erythematous mucosa are                         benign, consider repeat colonoscopy in the near future                         to re-evaluate this area. And also to re-evaluate the                         areas with vegetable matter present such as corn that                         could only be partially cleared                        - Discharge patient to home (with escort).                        -  Advance diet as tolerated.                        - Continue present medications.                        - Repeat colonoscopy date to be determined after                         pending pathology results are reviewed.                        - The findings and recommendations were discussed with                         the patient.                        - The findings and recommendations were discussed with                         the patient's family.                        - Return to primary care physician as previously                         scheduled.                        - High fiber diet. Procedure Code(s):     --- Professional ---                        [phone], Colonoscopy, flexible; with removal of                          tumor(s), polyp(s), or other lesion(s) by snare                         technique                        45380, 67, Colonoscopy, flexible; with biopsy, single                         or multiple Diagnosis Code(s):     --- Professional ---                        Z12.11, Encounter for screening for malignant neoplasm                         of colon                        K63.5, Polyp of colon                        K62.1, Rectal polyp                        K63.89, Other specified diseases of intestine CPT copyright 2019 American Medical Association. All rights reserved. The codes documented in  this report are preliminary and upon coder review may  be revised to meet current compliance requirements.  [name], MD Margretta Sidle B. Bonna Gains MD, MD 09/12/2020 9:09:30 AM This report has been signed electronically. Number of Addenda: 0 Note Initiated On: 09/12/2020 8:06 AM Scope Withdrawal Time: 0 hours 30 minutes 49 seconds  Total Procedure Duration: 0 hours 43 minutes 22 seconds  Estimated Blood Loss:  Estimated blood loss: none.      [facility]

## 2020-09-12 NOTE — H&P (Signed)
[name], MD [address], [place], [place], [place], [id number], [place], [place], [place], [address] Phone: [phone]  Fax: [phone]  Primary Care Physician:  Jonetta Osgood, NP   Pre-Procedure History & Physical: HPI:  Tony Collier is a 45 y.o. male is here for a colonoscopy.   Past Medical History:  Diagnosis Date   Hypertension    Insomnia    Seizures ([place])    Seizures ([place])     Past Surgical History:  Procedure Laterality Date   FOOT SURGERY      Prior to Admission medications   Medication Sig Start Date End Date Taking? Authorizing Provider  amLODipine (NORVASC) 5 MG tablet Take 1 tablet (5 mg total) by mouth daily. 07/28/20  Yes Lavera Guise, MD  carbamazepine (TEGRETOL) 100 MG chewable tablet Take 4 Tablets in AM and 4 Tablets in PM 08/07/20  Yes Abernathy, Alyssa, NP  hydrochlorothiazide (HYDRODIURIL) 25 MG tablet Take 1 tablet (25 mg total) by mouth daily. 08/07/20  Yes Abernathy, Yetta Flock, NP  amoxicillin-clavulanate (AUGMENTIN) 875-125 MG tablet Take 1 tablet by mouth 2 (two) times daily. Patient not taking: Reported on 09/12/2020 07/24/20   Jonetta Osgood, NP  azelastine (ASTELIN) 0.1 % nasal spray Place into the nose. 06/14/20   [provider]  levETIRAcetam (KEPPRA) 500 MG tablet Take 1 tablet (500 mg total) by mouth 2 (two) times daily. 12/24/19 04/22/20  Kendell Bane, NP  Na Sulfate-K Sulfate-Mg Sulf (SUPREP BOWEL PREP KIT) 17.5-3.13-1.6 GM/177ML SOLN Take 1 kit by mouth as directed. 08/14/20   Lucilla Lame, MD  traZODone (DESYREL) 100 MG tablet Take 1 tablet (100 mg total) by mouth at bedtime as needed for sleep. 08/07/20   Jonetta Osgood, NP    Allergies as of 08/12/2020 - Review Complete 08/07/2020  Allergen Reaction Noted   Tylenol [acetaminophen]  08/07/2020    Family History  Problem Relation Age of Onset   Hypertension Mother     Social History   Socioeconomic History   Marital status: Married     Spouse name: Not on file   Number of children: Not on file   Years of education: Not on file   Highest education level: Not on file  Occupational History   Not on file  Tobacco Use   Smoking status: Former    Pack years: 0.00    Types: Cigarettes    Quit date: 12/08/2017    Years since quitting: 2.7   Smokeless tobacco: Never  Vaping Use   Vaping Use: Never used  Substance and Sexual Activity   Alcohol use: Yes    Comment: ocassional   Drug use: Never   Sexual activity: Not on file  Other Topics Concern   Not on file  Social History Narrative   Not on file   Social Determinants of Health   Financial Resource Strain: Not on file  Food Insecurity: Not on file  Transportation Needs: Not on file  Physical Activity: Not on file  Stress: Not on file  Social Connections: Not on file  Intimate Partner Violence: Not on file    Review of Systems: See HPI, otherwise negative ROS  Physical Exam: Constitutional: General:   Alert,  Well-developed, well-nourished, pleasant and cooperative in NAD BP 118/80   Pulse 78   Temp (!) 96.5 F (35.8 C) (Temporal)   Resp 17   Ht $R'5\' 6"'FM$  (1.676 m)   Wt 95.3 kg   SpO2 95%   BMI 33.89 kg/m  Head: Normocephalic, atraumatic.   Eyes:  Sclera clear, no icterus.   Conjunctiva pink.   Mouth:  No deformity or lesions, oropharynx pink & moist.  Neck:  Supple, trachea midline  Respiratory: Normal respiratory effort  Gastrointestinal:  Soft, non-tender and non-distended without masses, hepatosplenomegaly or hernias noted.  No guarding or rebound tenderness.     Cardiac: No clubbing or edema.  No cyanosis. Normal posterior tibial pedal pulses noted.  Lymphatic:  No significant cervical adenopathy.  Psych:  Alert and cooperative. Normal mood and affect.  Musculoskeletal:   Symmetrical without gross deformities. 5/5 Lower extremity strength bilaterally.  Skin: Warm. Intact without significant lesions or rashes. No  jaundice.  Neurologic:  Face symmetrical, tongue midline, Normal sensation to touch;  grossly normal neurologically.  Psych:  Alert and oriented x3, Alert and cooperative. Normal mood and affect.  Impression/Plan: Tony Collier is here for a colonoscopy to be performed for average risk screening.  Risks, benefits, limitations, and alternatives regarding  colonoscopy have been reviewed with the patient.  Questions have been answered.  All parties agreeable.   Virgel Manifold, MD  09/12/2020, 8:08 AM

## 2020-09-12 NOTE — Anesthesia Procedure Notes (Signed)
Date/Time: 09/12/2020 8:10 AM Performed by: Allean Found, CRNA Pre-anesthesia Checklist: Patient identified, Emergency Drugs available, Suction available, Patient being monitored and Timeout performed Oxygen Delivery Method: Nasal cannula Placement Confirmation: positive ETCO2

## 2020-09-12 NOTE — Anesthesia Preprocedure Evaluation (Signed)
Anesthesia Evaluation  Patient identified by MRN, date of birth, ID band Patient awake    Reviewed: Allergy & Precautions, H&P , NPO status , Patient's Chart, lab work & pertinent test results, reviewed documented beta blocker date and time   History of Anesthesia Complications Negative for: history of anesthetic complications  Airway Mallampati: IV  TM Distance: >3 FB Neck ROM: full    Dental  (+) Dental Advidsory Given   Pulmonary neg pulmonary ROS, former smoker,    Pulmonary exam normal breath sounds clear to auscultation       Cardiovascular Exercise Tolerance: Good hypertension, (-) angina(-) Past MI and (-) Cardiac Stents Normal cardiovascular exam(-) dysrhythmias (-) Valvular Problems/Murmurs Rhythm:regular Rate:Normal     Neuro/Psych Seizures -, Well Controlled,  negative psych ROS   GI/Hepatic negative GI ROS, Neg liver ROS,   Endo/Other  negative endocrine ROS  Renal/GU negative Renal ROS  negative genitourinary   Musculoskeletal   Abdominal   Peds  Hematology negative hematology ROS (+)   Anesthesia Other Findings Past Medical History: No date: Hypertension No date: Insomnia No date: Seizures ([facility]) No date: Seizures ([facility])   Reproductive/Obstetrics negative OB ROS                             Anesthesia Physical Anesthesia Plan  ASA: 2  Anesthesia Plan: General   Post-op Pain Management:    Induction: Intravenous  PONV Risk Score and Plan: 2 and TIVA and Propofol infusion  Airway Management Planned: Natural Airway and Nasal Cannula  Additional Equipment:   Intra-op Plan:   Post-operative Plan:   Informed Consent: I have reviewed the patients History and Physical, chart, labs and discussed the procedure including the risks, benefits and alternatives for the proposed anesthesia with the patient or authorized representative who has indicated his/her understanding  and acceptance.     Dental Advisory Given  Plan Discussed with: Anesthesiologist, CRNA and Surgeon  Anesthesia Plan Comments:         Anesthesia Quick Evaluation

## 2020-09-16 ENCOUNTER — Encounter: Payer: Self-pay | Admitting: Gastroenterology

## 2020-09-16 LAB — SURGICAL PATHOLOGY

## 2020-09-29 ENCOUNTER — Other Ambulatory Visit: Payer: Self-pay | Admitting: Internal Medicine

## 2020-09-29 ENCOUNTER — Other Ambulatory Visit: Payer: Self-pay

## 2020-09-29 DIAGNOSIS — I10 Essential (primary) hypertension: Secondary | ICD-10-CM

## 2020-09-29 MED ORDER — AMLODIPINE BESYLATE 5 MG PO TABS: ORAL_TABLET | ORAL | 0 refills | Status: DC

## 2020-10-13 ENCOUNTER — Other Ambulatory Visit: Payer: Self-pay

## 2020-10-13 ENCOUNTER — Encounter: Payer: Self-pay | Admitting: Nurse Practitioner

## 2020-10-13 ENCOUNTER — Ambulatory Visit (INDEPENDENT_AMBULATORY_CARE_PROVIDER_SITE_OTHER): Payer: 59 | Admitting: Nurse Practitioner

## 2020-10-13 VITALS — BP 130/90 | HR 98 | Temp 98.6°F | Resp 16 | Ht 66.0 in | Wt 220.6 lb

## 2020-10-13 DIAGNOSIS — G479 Sleep disorder, unspecified: Secondary | ICD-10-CM

## 2020-10-13 DIAGNOSIS — Z23 Encounter for immunization: Secondary | ICD-10-CM

## 2020-10-13 DIAGNOSIS — N529 Male erectile dysfunction, unspecified: Secondary | ICD-10-CM

## 2020-10-13 DIAGNOSIS — I10 Essential (primary) hypertension: Secondary | ICD-10-CM | POA: Diagnosis not present

## 2020-10-13 DIAGNOSIS — E559 Vitamin D deficiency, unspecified: Secondary | ICD-10-CM

## 2020-10-13 DIAGNOSIS — E782 Mixed hyperlipidemia: Secondary | ICD-10-CM | POA: Diagnosis not present

## 2020-10-13 MED ORDER — AMLODIPINE BESYLATE 5 MG PO TABS: ORAL_TABLET | ORAL | 1 refills | Status: DC

## 2020-10-13 MED ORDER — PNEUMOCOCCAL 20-VAL CONJ VACC 0.5 ML IM SUSY: 0.5000 mL | PREFILLED_SYRINGE | INTRAMUSCULAR | 0 refills | Status: AC

## 2020-10-13 MED ORDER — ATORVASTATIN CALCIUM 10 MG PO TABS: 10.0000 mg | ORAL_TABLET | Freq: Every day | ORAL | 1 refills | Status: DC

## 2020-10-13 MED ORDER — SILDENAFIL CITRATE 100 MG PO TABS: 100.0000 mg | ORAL_TABLET | Freq: Every day | ORAL | 2 refills | Status: DC | PRN

## 2020-10-13 NOTE — Progress Notes (Signed)
[facility] [address] [place], [place] [address]  Internal MEDICINE  Office Visit Note  Patient Name: Tony Collier  [id number]  [id number]  Date of Service: 10/13/2020  Chief Complaint  Patient presents with   Follow-up    Refills, BP check, discuss meds   Hypertension    HPI Tony Collier presents for a follow up visit for medication refills and review, blood pressure check for hypertension.  He is accompanied by his wife to today's office visit. He has a history of seizures and is followed neurology. He is taking carbamazepine and keppra to control his seizures. He is having another problem now with his memory. He said it has actually been a problem for a while. He reports not being able to remember something his wife told him 5 minutes ago and this happens frequently. He also reports often getting lost when going to familiar places and having increasing forgetfulness. His blood pressure is stable since his medication was adjusted at his last office visit.  Sleeping better with trazodone.  Colonoscopy done in July 2022, a couple of polyps were removed and were found to be benign. Diverticulosis found in sigmoid colon and active chronic colitis. Per GI, they plan to do a repeat colonoscopy in the future to reevaluate a few areas.    Linzess samples, issue with constipation sometimes, Had colonoscopy on 09/12/20 Refilled of amlodipine Bp stable  Start atorvastatin 10 mg daily and fish oil 1000 mg Vita d 2000 to 5000 units daily/   Otc flonase   Current Medication: Outpatient Encounter Medications as of 10/13/2020  Medication Sig   atorvastatin (LIPITOR) 10 MG tablet Take 1 tablet (10 mg total) by mouth daily.   carbamazepine (TEGRETOL) 100 MG chewable tablet Take 4 Tablets in AM and 4 Tablets in PM   hydrochlorothiazide (HYDRODIURIL) 25 MG tablet Take 1 tablet (25 mg total) by mouth daily.   sildenafil (VIAGRA) 100 MG tablet Take 1 tablet (100 mg total) by mouth daily  as needed for erectile dysfunction.   traZODone (DESYREL) 100 MG tablet Take 1 tablet (100 mg total) by mouth at bedtime as needed for sleep.   [DISCONTINUED] amLODipine (NORVASC) 5 MG tablet Take one tab a day for BP   [DISCONTINUED] pneumococcal 20-Val Conj Vacc (PREVNAR 20) 0.5 ML injection Inject 0.5 mLs into the muscle tomorrow at 10 am.   amLODipine (NORVASC) 5 MG tablet Take one tab a day for BP   [EXPIRED] pneumococcal 20-Val Conj Vacc (PREVNAR 20) 0.5 ML injection Inject 0.5 mLs into the muscle tomorrow at 10 am for 1 dose.   [DISCONTINUED] amoxicillin-clavulanate (AUGMENTIN) 875-125 MG tablet Take 1 tablet by mouth 2 (two) times daily. (Patient not taking: No sig reported)   [DISCONTINUED] azelastine (ASTELIN) 0.1 % nasal spray Place into the nose. (Patient not taking: Reported on 10/13/2020)   [DISCONTINUED] levETIRAcetam (KEPPRA) 500 MG tablet Take 1 tablet (500 mg total) by mouth 2 (two) times daily.   [DISCONTINUED] Na Sulfate-K Sulfate-Mg Sulf (SUPREP BOWEL PREP KIT) 17.5-3.13-1.6 GM/177ML SOLN Take 1 kit by mouth as directed. (Patient not taking: Reported on 10/13/2020)   No facility-administered encounter medications on file as of 10/13/2020.    Surgical History: Past Surgical History:  Procedure Laterality Date   COLONOSCOPY N/A 09/12/2020   Procedure: COLONOSCOPY;  Surgeon: Pasty Spillers, MD;  Location: [facility] ENDOSCOPY;  Service: Endoscopy;  Laterality: N/A;   FOOT SURGERY      Medical History: Past Medical History:  Diagnosis Date  Hypertension    Insomnia    Seizures ([facility])    Seizures ([place])     Family History: Family History  Problem Relation Age of Onset   Hypertension Mother     Social History   Socioeconomic History   Marital status: Married    Spouse name: Not on file   Number of children: Not on file   Years of education: Not on file   Highest education level: Not on file  Occupational History   Not on file  Tobacco Use   Smoking status: Former     Types: Cigarettes    Quit date: 12/08/2017    Years since quitting: 2.8   Smokeless tobacco: Never  Vaping Use   Vaping Use: Never used  Substance and Sexual Activity   Alcohol use: Yes    Comment: ocassional   Drug use: Never   Sexual activity: Not on file  Other Topics Concern   Not on file  Social History Narrative   Not on file   Social Determinants of Health   Financial Resource Strain: Not on file  Food Insecurity: Not on file  Transportation Needs: Not on file  Physical Activity: Not on file  Stress: Not on file  Social Connections: Not on file  Intimate Partner Violence: Not on file      Review of Systems  Constitutional:  Negative for chills, fatigue, fever and unexpected weight change.  HENT:  Negative for congestion, rhinorrhea, sneezing and sore throat.   Eyes:  Negative for redness.  Respiratory:  Negative for cough, chest tightness and shortness of breath.   Cardiovascular:  Negative for chest pain and palpitations.  Gastrointestinal:  Negative for abdominal pain, constipation, diarrhea, nausea and vomiting.  Genitourinary:  Negative for dysuria and frequency.  Musculoskeletal:  Negative for arthralgias, back pain, joint swelling and neck pain.  Skin:  Negative for rash.  Neurological: Negative.  Negative for tremors and numbness.  Hematological:  Negative for adenopathy. Does not bruise/bleed easily.  Psychiatric/Behavioral:  Negative for behavioral problems (Depression), sleep disturbance and suicidal ideas. The patient is not nervous/anxious.    Vital Signs: BP 130/90   Pulse 98   Temp 98.6 F (37 C)   Resp 16   Ht $R'5\' 6"'LV$  (1.676 m)   Wt 220 lb 9.6 oz (100.1 kg)   SpO2 97%   BMI 35.61 kg/m    Physical Exam Vitals reviewed.  Constitutional:      General: He is not in acute distress.    Appearance: Normal appearance. He is obese. He is not ill-appearing.  HENT:     Head: Normocephalic and atraumatic.  Cardiovascular:     Rate and Rhythm:  Normal rate and regular rhythm.  Pulmonary:     Effort: Pulmonary effort is normal. No respiratory distress.  Skin:    General: Skin is warm and dry.     Capillary Refill: Capillary refill takes less than 2 seconds.  Neurological:     Mental Status: He is alert and oriented to person, place, and time.  Psychiatric:        Mood and Affect: Mood normal.        Behavior: Behavior normal.     Assessment/Plan: 1. Essential hypertension Stable with changes made at previous office visit, no changes, refill ordered.  - amLODipine (NORVASC) 5 MG tablet; Take one tab a day for BP  Dispense: 90 tablet; Refill: 1  2. Erectile dysfunction, unspecified erectile dysfunction type Requesting medication for this problem, prefers  to talk about this problem without his wife present. Prescription sent to pharmacy. He will call to let the clinic know if he needs this medication refilled or changed.  - sildenafil (VIAGRA) 100 MG tablet; Take 1 tablet (100 mg total) by mouth daily as needed for erectile dysfunction.  Dispense: 30 tablet; Refill: 2  3. Vitamin D deficiency Recommended vitamin D supplement OTC 2000 - 5000 units daily.  4. Mixed hyperlipidemia Stable, taking atorvastatin, recommended OTC fish oil supplement 1000 mg daily.  - atorvastatin (LIPITOR) 10 MG tablet; Take 1 tablet (10 mg total) by mouth daily.  Dispense: 90 tablet; Refill: 1  5. Sleep disturbance Improved with trazodone  6. Need for vaccination Order sent to pharmacy - pneumococcal 20-Val Conj Vacc (PREVNAR 20) 0.5 ML injection; Inject 0.5 mLs into the muscle tomorrow at 10 am for 1 dose.  Dispense: 0.5 mL; Refill: 0   General Counseling: Tony Collier verbalizes understanding of the findings of todays visit and agrees with plan of treatment. I have discussed any further diagnostic evaluation that may be needed or ordered today. We also reviewed his medications today. he has been encouraged to call the office with any questions or  concerns that should arise related to todays visit.    No orders of the defined types were placed in this encounter.   Meds ordered this encounter  Medications   pneumococcal 20-Val Conj Vacc (PREVNAR 20) 0.5 ML injection    Sig: Inject 0.5 mLs into the muscle tomorrow at 10 am for 1 dose.    Dispense:  0.5 mL    Refill:  0   amLODipine (NORVASC) 5 MG tablet    Sig: Take one tab a day for BP    Dispense:  90 tablet    Refill:  1   atorvastatin (LIPITOR) 10 MG tablet    Sig: Take 1 tablet (10 mg total) by mouth daily.    Dispense:  90 tablet    Refill:  1   sildenafil (VIAGRA) 100 MG tablet    Sig: Take 1 tablet (100 mg total) by mouth daily as needed for erectile dysfunction.    Dispense:  30 tablet    Refill:  2    Return in about 3 months (around 01/13/2021) for F/U, med refill, BP check, Jeson Camacho PCP.   Total time spent:30 Minutes Time spent includes review of chart, medications, test results, and follow up plan with the patient.   [place] Controlled Substance Database was reviewed by me.  This patient was seen by Jonetta Osgood, FNP-C in collaboration with Dr. Clayborn Bigness as a part of collaborative care agreement.   Arriyanna Mersch R. Valetta Fuller, MSN, FNP-C Internal medicine

## 2020-10-16 ENCOUNTER — Other Ambulatory Visit: Payer: Self-pay

## 2020-10-16 ENCOUNTER — Encounter: Payer: Self-pay | Admitting: Gastroenterology

## 2020-10-16 ENCOUNTER — Ambulatory Visit (INDEPENDENT_AMBULATORY_CARE_PROVIDER_SITE_OTHER): Payer: 59 | Admitting: Gastroenterology

## 2020-10-16 VITALS — BP 125/87 | HR 98 | Temp 98.1°F | Ht 66.0 in | Wt 221.0 lb

## 2020-10-16 DIAGNOSIS — K529 Noninfective gastroenteritis and colitis, unspecified: Secondary | ICD-10-CM | POA: Diagnosis not present

## 2020-10-16 DIAGNOSIS — R109 Unspecified abdominal pain: Secondary | ICD-10-CM

## 2020-10-16 NOTE — Progress Notes (Signed)
Tony Antigua, MD [address]  [place], [place] [address]  Main: [phone]  Fax: [phone]   Primary Care Physician: Jonetta Osgood, NP   Chief complaint: Abdominal cramping  HPI: Tony Collier is a 45 y.o. male here for follow-up after colonoscopy done on September 12, 2020, about a month ago.  Patient reports abdominal cramping since the procedure, but no pain.  Does report that bowel movements are a little "slower".  Since the colonoscopy.  States has a bowel movement every day or every other day but they are pebble-like or small.  No blood in stool.  No nausea or vomiting.  Denies any previous symptoms of such.  No blood in stool.  No dysphagia.  No heartburn  Colonoscopy showed 2 subcentimeter polyps that were removed.  An area of erythema was noted at 60 cm and was biopsied.  Colon polyps were benign on biopsies.  Area of erythema showed marked active colitis with features of chronicity.  Patient denies any previous history of diarrhea, abdominal pain, stomach cramping, blood in stool, weight loss to correlate to the above findings.  Reports occasional Aleve use, about once every 2 months.  He will take 2 Aleve for headache, but no other NSAID use.   ROS: All ROS reviewed and negative except as per HPI   Past Medical History:  Diagnosis Date   Hypertension    Insomnia    Seizures ([place])    Seizures ([place])     Past Surgical History:  Procedure Laterality Date   COLONOSCOPY N/A 09/12/2020   Procedure: COLONOSCOPY;  Surgeon: Virgel Manifold, MD;  Location: [facility] ENDOSCOPY;  Service: Endoscopy;  Laterality: N/A;   FOOT SURGERY      Prior to Admission medications   Medication Sig Start Date End Date Taking? Authorizing Provider  amLODipine (NORVASC) 5 MG tablet Take one tab a day for BP 10/13/20  Yes Abernathy, Alyssa, NP  atorvastatin (LIPITOR) 10 MG tablet Take 1 tablet (10 mg total) by mouth daily. 10/13/20  Yes Abernathy, Yetta Flock, NP   carbamazepine (TEGRETOL) 100 MG chewable tablet Take 4 Tablets in AM and 4 Tablets in PM 08/07/20  Yes Abernathy, Alyssa, NP  hydrochlorothiazide (HYDRODIURIL) 25 MG tablet Take 1 tablet (25 mg total) by mouth daily. 08/07/20  Yes Abernathy, Yetta Flock, NP  sildenafil (VIAGRA) 100 MG tablet Take 1 tablet (100 mg total) by mouth daily as needed for erectile dysfunction. 10/13/20  Yes Abernathy, Yetta Flock, NP  traZODone (DESYREL) 100 MG tablet Take 1 tablet (100 mg total) by mouth at bedtime as needed for sleep. 08/07/20  Yes Jonetta Osgood, NP    Family History  Problem Relation Age of Onset   Hypertension Mother      Social History   Tobacco Use   Smoking status: Former    Types: Cigarettes    Quit date: 12/08/2017    Years since quitting: 2.8   Smokeless tobacco: Never  Vaping Use   Vaping Use: Never used  Substance Use Topics   Alcohol use: Yes    Comment: ocassional   Drug use: Never    Allergies as of 10/16/2020 - Review Complete 10/16/2020  Allergen Reaction Noted   Tylenol [acetaminophen]  08/07/2020    Physical Examination:  Constitutional: General:   Alert,  Well-developed, well-nourished, pleasant and cooperative in NAD BP 125/87   Pulse 98   Temp 98.1 F (36.7 C) (Oral)   Ht '5\' 6"'$  (1.676 m)   Wt 221 lb (  100.2 kg)   BMI 35.67 kg/m   Respiratory: Normal respiratory effort  Gastrointestinal:  Soft, non-tender and non-distended without masses, hepatosplenomegaly or hernias noted.  No guarding or rebound tenderness.     Cardiac: No clubbing or edema.  No cyanosis. Normal posterior tibial pedal pulses noted.  Psych:  Alert and cooperative. Normal mood and affect.  Musculoskeletal:  Normal gait. Head normocephalic, atraumatic. Symmetrical without gross deformities. 5/5 Lower extremity strength bilaterally.  Skin: Warm. Intact without significant lesions or rashes. No jaundice.  Neck: Supple, trachea midline  Lymph: No cervical lymphadenopathy  Psych:  Alert and  oriented x3, Alert and cooperative. Normal mood and affect.  Labs: CMP     Component Value Date/Time   NA 142 08/18/2020 1556   K 3.7 08/18/2020 1556   CL 103 08/18/2020 1556   CO2 21 08/18/2020 1556   GLUCOSE 99 08/18/2020 1556   GLUCOSE 94 06/10/2015 1123   BUN 12 08/18/2020 1556   CREATININE 1.05 08/18/2020 1556   CALCIUM 9.5 08/18/2020 1556   PROT 8.1 08/18/2020 1556   ALBUMIN 4.8 08/18/2020 1556   AST 16 08/18/2020 1556   ALT 24 08/18/2020 1556   ALKPHOS 82 08/18/2020 1556   BILITOT 0.2 08/18/2020 1556   GFRNONAA 83 12/25/2019 1005   GFRAA 96 12/25/2019 1005   Lab Results  Component Value Date   WBC 5.6 08/18/2020   HGB 15.4 08/18/2020   HCT 44.9 08/18/2020   MCV 94 08/18/2020   PLT 240 08/18/2020    Imaging Studies:   Assessment and Plan:   [name] is a 44 y.o. y/o male who underwent colonoscopy in September 12, 2020 for screening and was noted to have an area of erythema at 60 cm into the colon with biopsies shows active colitis with features of chronicity, with patient denying any previous clinical symptoms to suggest IBD  Given that patient has never had chronic diarrhea and reports daily formed bowel movements, with no blood in stool, the finding of 1 area of localized erythema in his colon seen above, being from IBD is questionable at best  He is reporting some constipation and abdominal cramping since his colonoscopy and may have had some underlying constipation.  High-fiber diet MiraLAX daily with goal of 1-2 soft bowel movements daily.  If not at goal, patient instructed to increase dose to twice daily.  If loose stools with the medication, patient asked to decrease the medication to every other day, or half dose daily.  Patient verbalized understanding  If abdominal cramping and constipation is not improved in the next 2 to 3 weeks, patient advised to let us know  Would recommend repeat colonoscopy in 2 to 3 months and patient is agreeable with this  plan  However, if symptoms change before then patient advised to let us know and he verbalized understanding  Labs reviewed as per above and were otherwise reassuring with no anemia, normal electrolytes and liver enzymes  Patient advised to avoid any frequent NSAID use and him and his wife verbalized understanding  Dr Tony Collier

## 2020-10-16 NOTE — Patient Instructions (Signed)
Start taking Miralax daily  Samples given today

## 2020-11-08 ENCOUNTER — Other Ambulatory Visit: Payer: Self-pay | Admitting: Nurse Practitioner

## 2020-11-08 DIAGNOSIS — G40909 Epilepsy, unspecified, not intractable, without status epilepticus: Secondary | ICD-10-CM

## 2020-12-18 ENCOUNTER — Ambulatory Visit: Payer: 59 | Admitting: Gastroenterology

## 2021-01-12 ENCOUNTER — Emergency Department: Admission: EM | Admit: 2021-01-12 | Discharge: 2021-01-12 | Discharge status: Against Medical Advice | Payer: 59

## 2021-01-12 ENCOUNTER — Ambulatory Visit: Payer: Medicare Other | Admitting: Physician Assistant

## 2021-01-12 ENCOUNTER — Other Ambulatory Visit: Payer: Self-pay

## 2021-01-12 ENCOUNTER — Encounter: Payer: Self-pay | Admitting: Physician Assistant

## 2021-01-13 ENCOUNTER — Ambulatory Visit: Payer: 59 | Admitting: Nurse Practitioner

## 2021-01-13 ENCOUNTER — Telehealth: Payer: Self-pay

## 2021-01-13 DIAGNOSIS — Z0289 Encounter for other administrative examinations: Secondary | ICD-10-CM

## 2021-01-13 NOTE — Telephone Encounter (Signed)
Patient has nonworking #. Lvm for wife to return my call to schedule appointment per [facility]

## 2021-01-20 ENCOUNTER — Telehealth (INDEPENDENT_AMBULATORY_CARE_PROVIDER_SITE_OTHER): Payer: Medicare Other | Admitting: Nurse Practitioner

## 2021-01-20 ENCOUNTER — Other Ambulatory Visit: Payer: Self-pay

## 2021-01-20 ENCOUNTER — Telehealth: Payer: Self-pay

## 2021-01-20 ENCOUNTER — Encounter: Payer: Self-pay | Admitting: Nurse Practitioner

## 2021-01-20 VITALS — BP 137/84 | HR 87 | Resp 16 | Ht 66.0 in | Wt 210.0 lb

## 2021-01-20 DIAGNOSIS — R6889 Other general symptoms and signs: Secondary | ICD-10-CM | POA: Diagnosis not present

## 2021-01-20 DIAGNOSIS — R52 Pain, unspecified: Secondary | ICD-10-CM

## 2021-01-20 DIAGNOSIS — R051 Acute cough: Secondary | ICD-10-CM | POA: Diagnosis not present

## 2021-01-20 DIAGNOSIS — R42 Dizziness and giddiness: Secondary | ICD-10-CM

## 2021-01-20 MED ORDER — OSELTAMIVIR PHOSPHATE 75 MG PO CAPS: 75.0000 mg | ORAL_CAPSULE | Freq: Two times a day (BID) | ORAL | 0 refills | Status: DC

## 2021-01-20 MED ORDER — PROMETHAZINE-DM 6.25-15 MG/5ML PO SYRP: 5.0000 mL | ORAL_SOLUTION | Freq: Four times a day (QID) | ORAL | 0 refills | Status: DC | PRN

## 2021-01-20 MED ORDER — PROMETHAZINE VC/CODEINE 6.25-5-10 MG/5ML PO SYRP: 5.0000 mL | ORAL_SOLUTION | Freq: Every evening | ORAL | 0 refills | Status: DC | PRN

## 2021-01-20 NOTE — Telephone Encounter (Signed)
Phar called that don't carry promethazine with codeine they already canceled pres

## 2021-01-20 NOTE — Progress Notes (Signed)
[facility] [place], [place] [address]  Internal MEDICINE  Telephone Visit  Patient Name: Tony Collier  [id number]  [id number]  Date of Service: 01/20/2021  I connected with the patient at 3:40 PM by telephone and verified the patients identity using two identifiers.   I discussed the limitations, risks, security and privacy concerns of performing an evaluation and management service by telephone and the availability of in person appointments. I also discussed with the patient that there may be a patient responsible charge related to the service.  The patient expressed understanding and agrees to proceed.    Chief Complaint  Patient presents with   Acute Visit    Vertigo, and flu, discuss meds, pain in legs, feels sore and has hurt for the last 3 days   Telephone Assessment    video   Telephone Screen    [phone]    Dizziness   Cough    HPI Tony Collier presents for a telehealth virtual visit for vertigo and dizziness. He has been seen by urgent care in the past week and tested positive for the flu. He was prescribed tamiflu. He is now reporting pain in legs, feels sore and has been hurting for the past 3 days. He also has a cough. Body aches  and fatigue.     Current Medication: Outpatient Encounter Medications as of 01/20/2021  Medication Sig   amLODipine (NORVASC) 5 MG tablet Take one tab a day for BP   atorvastatin (LIPITOR) 10 MG tablet Take 1 tablet (10 mg total) by mouth daily.   carbamazepine (TEGRETOL) 100 MG chewable tablet TAKE 4 TABLETS IN THE MORNING AND 4 TABLETS IN THE EVENING.   hydrochlorothiazide (HYDRODIURIL) 25 MG tablet Take 1 tablet (25 mg total) by mouth daily.   promethazine-dextromethorphan (PROMETHAZINE-DM) 6.25-15 MG/5ML syrup Take 5 mLs by mouth 4 (four) times daily as needed for cough.   sildenafil (VIAGRA) 100 MG tablet Take 1 tablet (100 mg total) by mouth daily as needed for erectile dysfunction.   traZODone (DESYREL)  100 MG tablet Take 1 tablet (100 mg total) by mouth at bedtime as needed for sleep.   [DISCONTINUED] oseltamivir (TAMIFLU) 75 MG capsule Take 1 capsule (75 mg total) by mouth 2 (two) times daily. (Patient not taking: Reported on 02/09/2021)   [DISCONTINUED] Promethazine-Phenyleph-Codeine (PROMETHAZINE VC/CODEINE) 6.25-5-10 MG/5ML SYRP Take 5 mLs by mouth at bedtime as needed (cough and congestion).   No facility-administered encounter medications on file as of 01/20/2021.    Surgical History: Past Surgical History:  Procedure Laterality Date   COLONOSCOPY N/A 09/12/2020   Procedure: COLONOSCOPY;  Surgeon: Virgel Manifold, MD;  Location: [facility] ENDOSCOPY;  Service: Endoscopy;  Laterality: N/A;   FOOT SURGERY      Medical History: Past Medical History:  Diagnosis Date   Hypertension    Insomnia    Seizures ([place])    Seizures ([place])     Family History: Family History  Problem Relation Age of Onset   Hypertension Mother     Social History   Socioeconomic History   Marital status: Married    Spouse name: Not on file   Number of children: Not on file   Years of education: Not on file   Highest education level: Not on file  Occupational History   Not on file  Tobacco Use   Smoking status: Former    Types: Cigarettes    Quit date: 12/08/2017    Years since quitting: 3.1   Smokeless  tobacco: Never  Vaping Use   Vaping Use: Never used  Substance and Sexual Activity   Alcohol use: Yes    Comment: ocassional   Drug use: Never   Sexual activity: Not on file  Other Topics Concern   Not on file  Social History Narrative   Not on file   Social Determinants of Health   Financial Resource Strain: Not on file  Food Insecurity: Not on file  Transportation Needs: Not on file  Physical Activity: Not on file  Stress: Not on file  Social Connections: Not on file  Intimate Partner Violence: Not on file      Review of Systems  Constitutional:  Positive for fatigue. Negative  for appetite change, chills and fever.  HENT:  Negative for congestion, postnasal drip and rhinorrhea.   Eyes:  Negative for pain.  Respiratory:  Positive for cough. Negative for chest tightness, shortness of breath and wheezing.   Cardiovascular: Negative.  Negative for chest pain and palpitations.  Gastrointestinal: Negative.  Negative for abdominal pain, constipation, diarrhea, nausea and vomiting.  Musculoskeletal:  Positive for myalgias.       Achy sore legs  Skin: Negative.   Neurological:  Positive for dizziness and light-headedness. Negative for headaches.   Vital Signs: BP 137/84   Pulse 87   Resp 16   Ht 5\' 6"  (1.676 m)   Wt 210 lb (95.3 kg)   BMI 33.89 kg/m    Observation/Objective: He is alert and oriented and engages in conversation appropriately. He does not appear to be in any acute distress over video call.     Assessment/Plan: 1. Flu-like symptoms Has been treated for the flu with tamiflu when he was seen by urgent care and tested for flu. Continue symptomatic treatment with prescribed cough medicine and other OTC medications as needed.   2. Acute cough Medication prescribed for symptomatic relief of cough.  - promethazine-dextromethorphan (PROMETHAZINE-DM) 6.25-15 MG/5ML syrup; Take 5 mLs by mouth 4 (four) times daily as needed for cough.  Dispense: 240 mL; Refill: 0  3. Vertigo Ongoing problem for >1 year. He has had stroke like symptoms in the past and been sent to ER but left without being seen. He has had a CT head done that did not show any abnormalities.     General Counseling: Tony Collier understanding of the findings of today's phone visit and agrees with plan of treatment. I have discussed any further diagnostic evaluation that may be needed or ordered today. We also reviewed his medications today. he has been encouraged to call the office with any questions or concerns that should arise related to todays visit.  Return if symptoms worsen or  fail to improve.   No orders of the defined types were placed in this encounter.   Meds ordered this encounter  Medications   DISCONTD: oseltamivir (TAMIFLU) 75 MG capsule    Sig: Take 1 capsule (75 mg total) by mouth 2 (two) times daily.    Dispense:  10 capsule    Refill:  0   DISCONTD: Promethazine-Phenyleph-Codeine (PROMETHAZINE VC/CODEINE) 6.25-5-10 MG/5ML SYRP    Sig: Take 5 mLs by mouth at bedtime as needed (cough and congestion).    Dispense:  240 mL    Refill:  0   promethazine-dextromethorphan (PROMETHAZINE-DM) 6.25-15 MG/5ML syrup    Sig: Take 5 mLs by mouth 4 (four) times daily as needed for cough.    Dispense:  240 mL    Refill:  0  Time spent:10 Minutes Time spent with patient included reviewing progress notes, labs, imaging studies, and discussing plan for follow up.  [place] Controlled Substance Database was reviewed by me for overdose risk score (ORS) if appropriate.  This patient was seen by Jonetta Osgood, FNP-C in collaboration with Dr. Clayborn Bigness as a part of collaborative care agreement.  Tony Din R. Valetta Fuller, MSN, FNP-C Internal medicine

## 2021-02-09 ENCOUNTER — Telehealth: Payer: Self-pay

## 2021-02-09 ENCOUNTER — Encounter: Payer: Self-pay | Admitting: Internal Medicine

## 2021-02-09 ENCOUNTER — Other Ambulatory Visit: Payer: Self-pay

## 2021-02-09 ENCOUNTER — Telehealth (INDEPENDENT_AMBULATORY_CARE_PROVIDER_SITE_OTHER): Payer: Medicare Other | Admitting: Internal Medicine

## 2021-02-09 VITALS — Resp 16 | Ht 66.0 in | Wt 210.0 lb

## 2021-02-09 DIAGNOSIS — G40909 Epilepsy, unspecified, not intractable, without status epilepticus: Secondary | ICD-10-CM | POA: Diagnosis not present

## 2021-02-09 DIAGNOSIS — H9203 Otalgia, bilateral: Secondary | ICD-10-CM

## 2021-02-09 DIAGNOSIS — J45909 Unspecified asthma, uncomplicated: Secondary | ICD-10-CM

## 2021-02-09 MED ORDER — ALBUTEROL SULFATE HFA 108 (90 BASE) MCG/ACT IN AERS: 2.0000 | INHALATION_SPRAY | Freq: Four times a day (QID) | RESPIRATORY_TRACT | 0 refills | Status: DC | PRN

## 2021-02-09 MED ORDER — LEVOFLOXACIN 500 MG PO TABS: 500.0000 mg | ORAL_TABLET | Freq: Every day | ORAL | 0 refills | Status: AC

## 2021-02-09 NOTE — Progress Notes (Signed)
[facility] [place], [place] [address]  Internal MEDICINE  Telephone Visit  Patient Name: Tony Collier  [id number]  [id number]  Date of Service: 04/21/2021  I connected with the patient at 300 by telephone and verified the patients identity using two identifiers.   I discussed the limitations, risks, security and privacy concerns of performing an evaluation and management service by telephone and the availability of in person appointments. I also discussed with the patient that there may be a patient responsible charge related to the service.  The patient expressed understanding and agrees to proceed.    Chief Complaint  Patient presents with   Telephone Assessment    [phone] virtual   Telephone Screen   Cough   Sinusitis    Ears popping, body aches, chills, not able to check temp.   Influenza    Diagnosed with Flu by urgent care end of Oct 2022.  Got better and now feels like he has the flu again.  Symptoms started again Thursday.     HPI Pt is connected via tele-health  Congested in his chest. Pt has been coughing worse at night. Pt was diagnosed with flu in October, was treated.  Denies Fever but has chills  Ears have been full as well    Current Medication: Outpatient Encounter Medications as of 02/09/2021  Medication Sig   albuterol (VENTOLIN HFA) 108 (90 Base) MCG/ACT inhaler Inhale 2 puffs into the lungs every 6 (six) hours as needed for wheezing or shortness of breath.   hydrochlorothiazide (HYDRODIURIL) 25 MG tablet Take 1 tablet (25 mg total) by mouth daily.   [EXPIRED] levofloxacin (LEVAQUIN) 500 MG tablet Take 1 tablet (500 mg total) by mouth daily for 7 days.   promethazine-dextromethorphan (PROMETHAZINE-DM) 6.25-15 MG/5ML syrup Take 5 mLs by mouth 4 (four) times daily as needed for cough.   sildenafil (VIAGRA) 100 MG tablet Take 1 tablet (100 mg total) by mouth daily as needed for erectile dysfunction.   traZODone (DESYREL) 100 MG  tablet Take 1 tablet (100 mg total) by mouth at bedtime as needed for sleep.   [DISCONTINUED] amLODipine (NORVASC) 5 MG tablet Take one tab a day for BP   [DISCONTINUED] atorvastatin (LIPITOR) 10 MG tablet Take 1 tablet (10 mg total) by mouth daily.   [DISCONTINUED] carbamazepine (TEGRETOL) 100 MG chewable tablet TAKE 4 TABLETS IN THE MORNING AND 4 TABLETS IN THE EVENING.   [DISCONTINUED] oseltamivir (TAMIFLU) 75 MG capsule Take 1 capsule (75 mg total) by mouth 2 (two) times daily. (Patient not taking: Reported on 02/09/2021)   No facility-administered encounter medications on file as of 02/09/2021.    Surgical History: Past Surgical History:  Procedure Laterality Date   COLONOSCOPY N/A 09/12/2020   Procedure: COLONOSCOPY;  Surgeon: Virgel Manifold, MD;  Location: [facility] ENDOSCOPY;  Service: Endoscopy;  Laterality: N/A;   FOOT SURGERY      Medical History: Past Medical History:  Diagnosis Date   Hypertension    Insomnia    Seizures ([place])    Seizures ([place])     Family History: Family History  Problem Relation Age of Onset   Hypertension Mother     Social History   Socioeconomic History   Marital status: Married    Spouse name: Not on file   Number of children: Not on file   Years of education: Not on file   Highest education level: Not on file  Occupational History   Not on file  Tobacco Use  Smoking status: Former    Types: Cigarettes    Quit date: 12/08/2017    Years since quitting: 3.3   Smokeless tobacco: Never  Vaping Use   Vaping Use: Never used  Substance and Sexual Activity   Alcohol use: Yes    Comment: ocassional   Drug use: Never   Sexual activity: Not on file  Other Topics Concern   Not on file  Social History Narrative   Not on file   Social Determinants of Health   Financial Resource Strain: Not on file  Food Insecurity: Not on file  Transportation Needs: Not on file  Physical Activity: Not on file  Stress: Not on file  Social  Connections: Not on file  Intimate Partner Violence: Not on file      Review of Systems  Constitutional:  Negative for fatigue and fever.  HENT:  Positive for ear pain. Negative for congestion, mouth sores and postnasal drip.   Respiratory:  Positive for cough.   Cardiovascular:  Negative for chest pain.  Genitourinary:  Negative for flank pain.  Psychiatric/Behavioral: Negative.     Vital Signs: Resp 16   Ht $R'5\' 6"'sg$  (1.676 m)   Wt 210 lb (95.3 kg)   BMI 33.89 kg/m    Observation/Objective:  NAD    Assessment/Plan: 1. Acute asthmatic bronchitis Get CXR, Treat as indicated  - Carbamazepine level, total - CBC with Differential/Platelet - CMP14+EGFR - DG Chest 2 View; Future  2. Seizure disorder (HCC) Stable  - Carbamazepine level, total - CBC with Differential/Platelet - CMP14+EGFR  3. Otalgia of both ears ??  DG Chest 2 View; Future, will await results, levaquin 500 mg po qd x 7 days   General Counseling: Eugene verbalizes understanding of the findings of today's phone visit and agrees with plan of treatment. I have discussed any further diagnostic evaluation that may be needed or ordered today. We also reviewed his medications today. he has been encouraged to call the office with any questions or concerns that should arise related to todays visit.    Orders Placed This Encounter  Procedures   DG Chest 2 View   Carbamazepine level, total   CBC with Differential/Platelet   CMP14+EGFR    Meds ordered this encounter  Medications   albuterol (VENTOLIN HFA) 108 (90 Base) MCG/ACT inhaler    Sig: Inhale 2 puffs into the lungs every 6 (six) hours as needed for wheezing or shortness of breath.    Dispense:  8 g    Refill:  0   levofloxacin (LEVAQUIN) 500 MG tablet    Sig: Take 1 tablet (500 mg total) by mouth daily for 7 days.    Dispense:  7 tablet    Refill:  0    Time spent:20 Minutes    Dr Lavera Guise Internal medicine

## 2021-02-09 NOTE — Telephone Encounter (Signed)
Called patient. Instructed him to go to [facility] for chest x-ray-Toni

## 2021-02-09 NOTE — Telephone Encounter (Signed)
Spoke to pt's wife and informed her that [facility] sent levaquin 500 mg and albuterol to the pharmacy and placed an order for chest xray.  Advised that pt will need a f/u appt after he gets xray

## 2021-02-10 ENCOUNTER — Ambulatory Visit
Admission: RE | Admit: 2021-02-10 | Discharge: 2021-02-10 | Disposition: A | Discharge status: Home / Self Care | Payer: Medicare Other | Source: Ambulatory Visit | Attending: Internal Medicine | Admitting: Internal Medicine

## 2021-02-10 ENCOUNTER — Ambulatory Visit
Admission: RE | Admit: 2021-02-10 | Discharge: 2021-02-10 | Disposition: A | Discharge status: Home / Self Care | Payer: Medicare Other | Attending: Internal Medicine | Admitting: Internal Medicine

## 2021-02-10 DIAGNOSIS — J45909 Unspecified asthma, uncomplicated: Secondary | ICD-10-CM | POA: Insufficient documentation

## 2021-02-15 ENCOUNTER — Encounter: Payer: Self-pay | Admitting: Nurse Practitioner

## 2021-04-08 ENCOUNTER — Other Ambulatory Visit: Payer: Self-pay | Admitting: Nurse Practitioner

## 2021-04-08 DIAGNOSIS — E782 Mixed hyperlipidemia: Secondary | ICD-10-CM

## 2021-04-14 ENCOUNTER — Other Ambulatory Visit: Payer: Self-pay

## 2021-04-14 DIAGNOSIS — I10 Essential (primary) hypertension: Secondary | ICD-10-CM

## 2021-04-14 DIAGNOSIS — E782 Mixed hyperlipidemia: Secondary | ICD-10-CM

## 2021-04-14 DIAGNOSIS — G40909 Epilepsy, unspecified, not intractable, without status epilepticus: Secondary | ICD-10-CM

## 2021-04-14 MED ORDER — ATORVASTATIN CALCIUM 10 MG PO TABS: 10.0000 mg | ORAL_TABLET | Freq: Every day | ORAL | 1 refills | Status: DC

## 2021-04-14 MED ORDER — AMLODIPINE BESYLATE 5 MG PO TABS: ORAL_TABLET | ORAL | 1 refills | Status: DC

## 2021-04-14 MED ORDER — CARBAMAZEPINE 100 MG PO CHEW: CHEWABLE_TABLET | ORAL | 3 refills | Status: DC

## 2021-04-21 ENCOUNTER — Encounter: Payer: Self-pay | Admitting: Nurse Practitioner

## 2021-04-21 ENCOUNTER — Ambulatory Visit (INDEPENDENT_AMBULATORY_CARE_PROVIDER_SITE_OTHER): Payer: Medicare Other | Admitting: Internal Medicine

## 2021-04-21 ENCOUNTER — Other Ambulatory Visit: Payer: Self-pay

## 2021-04-21 VITALS — BP 132/90 | HR 90 | Temp 98.3°F | Resp 16 | Ht 66.0 in | Wt 234.6 lb

## 2021-04-21 DIAGNOSIS — R300 Dysuria: Secondary | ICD-10-CM | POA: Diagnosis not present

## 2021-04-21 DIAGNOSIS — M545 Low back pain, unspecified: Secondary | ICD-10-CM | POA: Diagnosis not present

## 2021-04-21 LAB — POCT URINALYSIS DIPSTICK
Bilirubin, UA: NEGATIVE
Blood, UA: NEGATIVE
Glucose, UA: NEGATIVE
Ketones, UA: NEGATIVE
Leukocytes, UA: NEGATIVE
Nitrite, UA: NEGATIVE
Protein, UA: NEGATIVE
Spec Grav, UA: 1.015 (ref 1.010–1.025)
Urobilinogen, UA: 0.2 E.U./dL
pH, UA: 6.5 (ref 5.0–8.0)

## 2021-04-21 MED ORDER — CYCLOBENZAPRINE HCL 10 MG PO TABS: 10.0000 mg | ORAL_TABLET | Freq: Every day | ORAL | 1 refills | Status: DC

## 2021-04-21 MED ORDER — PREDNISONE 10 MG PO TABS: ORAL_TABLET | ORAL | 0 refills | Status: DC

## 2021-04-21 MED ORDER — METHYLPREDNISOLONE ACETATE 80 MG/ML IJ SUSP
80.0000 mg | Freq: Once | INTRAMUSCULAR | Status: AC
  Administered 2021-04-21: 80 mg via INTRAMUSCULAR

## 2021-04-21 MED ORDER — MELOXICAM 15 MG PO TABS: 15.0000 mg | ORAL_TABLET | Freq: Every day | ORAL | 0 refills | Status: DC

## 2021-04-21 NOTE — Progress Notes (Signed)
[facility] [place], [place] [address]  Internal MEDICINE  Office Visit Note  Patient Name: Tony Collier  [id number]  [id number]  Date of Service: 04/21/2021  Chief Complaint  Patient presents with   Acute Visit   Back Pain    Lower back pain For 1 week, hurts when urinating, urgency and frequency, painful to stand or sit and to lay down, hurts to turn side to side     HPI  Pt is here for acute and sick visit C/O back pain, unable to move and bend, going on for one week, no h/o trauma or kidney stones, denies any fever or chills    Current Medication: Outpatient Encounter Medications as of 04/21/2021  Medication Sig   albuterol (VENTOLIN HFA) 108 (90 Base) MCG/ACT inhaler Inhale 2 puffs into the lungs every 6 (six) hours as needed for wheezing or shortness of breath.   amLODipine (NORVASC) 5 MG tablet Take one tab a day for BP   atorvastatin (LIPITOR) 10 MG tablet Take 1 tablet (10 mg total) by mouth daily.   carbamazepine (TEGRETOL) 100 MG chewable tablet TAKE 4 TABLETS IN THE MORNING AND 4 TABLETS IN THE EVENING.   hydrochlorothiazide (HYDRODIURIL) 25 MG tablet Take 1 tablet (25 mg total) by mouth daily.   promethazine-dextromethorphan (PROMETHAZINE-DM) 6.25-15 MG/5ML syrup Take 5 mLs by mouth 4 (four) times daily as needed for cough.   sildenafil (VIAGRA) 100 MG tablet Take 1 tablet (100 mg total) by mouth daily as needed for erectile dysfunction.   traZODone (DESYREL) 100 MG tablet Take 1 tablet (100 mg total) by mouth at bedtime as needed for sleep.   No facility-administered encounter medications on file as of 04/21/2021.    Surgical History: Past Surgical History:  Procedure Laterality Date   COLONOSCOPY N/A 09/12/2020   Procedure: COLONOSCOPY;  Surgeon: Virgel Manifold, MD;  Location: [facility] ENDOSCOPY;  Service: Endoscopy;  Laterality: N/A;   FOOT SURGERY      Medical History: Past Medical History:  Diagnosis Date   Hypertension     Insomnia    Seizures ([place])    Seizures ([place])     Family History: Family History  Problem Relation Age of Onset   Hypertension Mother     Social History   Socioeconomic History   Marital status: Married    Spouse name: Not on file   Number of children: Not on file   Years of education: Not on file   Highest education level: Not on file  Occupational History   Not on file  Tobacco Use   Smoking status: Former    Types: Cigarettes    Quit date: 12/08/2017    Years since quitting: 3.3   Smokeless tobacco: Never  Vaping Use   Vaping Use: Never used  Substance and Sexual Activity   Alcohol use: Yes    Comment: ocassional   Drug use: Never   Sexual activity: Not on file  Other Topics Concern   Not on file  Social History Narrative   Not on file   Social Determinants of Health   Financial Resource Strain: Not on file  Food Insecurity: Not on file  Transportation Needs: Not on file  Physical Activity: Not on file  Stress: Not on file  Social Connections: Not on file  Intimate Partner Violence: Not on file      Review of Systems  Constitutional:  Negative for chills, fatigue and unexpected weight change.  HENT:  Negative for  congestion.   Eyes:  Negative for redness.  Respiratory:  Negative for cough, chest tightness and shortness of breath.   Cardiovascular:  Negative for palpitations.  Gastrointestinal:  Negative for constipation, diarrhea, nausea and vomiting.  Genitourinary:  Positive for frequency. Negative for dysuria.  Musculoskeletal:  Positive for arthralgias and back pain. Negative for joint swelling and neck pain.  Skin:  Negative for rash.  Neurological: Negative.  Negative for tremors.  Hematological:  Negative for adenopathy. Does not bruise/bleed easily.  Psychiatric/Behavioral:  Negative for sleep disturbance and suicidal ideas. Behavioral problem: Depression.The patient is not nervous/anxious.    Vital Signs: BP 132/90    Pulse 90    Temp 98.3  F (36.8 C)    Resp 16    Ht 5\' 6"  (1.676 m)    Wt 234 lb 9.6 oz (106.4 kg)    SpO2 97%    BMI 37.87 kg/m    Physical Exam Cardiovascular:     Rate and Rhythm: Normal rate.  Musculoskeletal:        General: Tenderness present.     Comments: Restricted ROM at LS spine   Neurological:     Gait: Gait abnormal.       Assessment/Plan: 1. Acute bilateral low back pain without sciatica Will order xray, work note is given until Friday  if pt continues to have problem will need to see ortho  - cyclobenzaprine (FLEXERIL) 10 MG tablet; Take 1 tablet (10 mg total) by mouth at bedtime. Take one tab po qhs for back spasm prn only  Dispense: 30 tablet; Refill: 1 - meloxicam (MOBIC) 15 MG tablet; Take 1 tablet (15 mg total) by mouth daily.  Dispense: 30 tablet; Refill: 0 - predniSONE (DELTASONE) 10 MG tablet; Take one tab 3 x day for 3 days, then take one tab 2 x a day for 3 days and then take one tab a day for 3 days for copd  Dispense: 18 tablet; Refill: 0 - DG Lumbar Spine Complete; Future - methylPREDNISolone acetate (DEPO-MEDROL) injection 80 mg - Ambulatory referral to Orthopedic Surgery  2. Dysuria Urine is negative for any acute pathology  - POCT Urinalysis Dipstick    General Counseling: Tony Collier verbalizes understanding of the findings of todays visit and agrees with plan of treatment. I have discussed any further diagnostic evaluation that may be needed or ordered today. We also reviewed his medications today. he has been encouraged to call the office with any questions or concerns that should arise related to todays visit.    Orders Placed This Encounter  Procedures   POCT Urinalysis Dipstick    No orders of the defined types were placed in this encounter.   Total time spent:25 Minutes Time spent includes review of chart, medications, test results, and follow up plan with the patient.   [place] Controlled Substance Database was reviewed by me.   Dr Lavera Guise Internal  medicine

## 2021-04-22 ENCOUNTER — Ambulatory Visit
Admission: RE | Admit: 2021-04-22 | Discharge: 2021-04-22 | Disposition: A | Discharge status: Home / Self Care | Payer: Medicare Other | Attending: Internal Medicine | Admitting: Internal Medicine

## 2021-04-22 ENCOUNTER — Ambulatory Visit
Admission: RE | Admit: 2021-04-22 | Discharge: 2021-04-22 | Disposition: A | Discharge status: Home / Self Care | Payer: Medicare Other | Source: Ambulatory Visit | Attending: Internal Medicine | Admitting: Internal Medicine

## 2021-04-22 DIAGNOSIS — M545 Low back pain, unspecified: Secondary | ICD-10-CM | POA: Insufficient documentation

## 2021-04-27 ENCOUNTER — Other Ambulatory Visit: Payer: Self-pay

## 2021-04-27 ENCOUNTER — Telehealth: Payer: Self-pay

## 2021-04-27 DIAGNOSIS — G40909 Epilepsy, unspecified, not intractable, without status epilepticus: Secondary | ICD-10-CM

## 2021-04-27 DIAGNOSIS — I10 Essential (primary) hypertension: Secondary | ICD-10-CM

## 2021-04-27 DIAGNOSIS — E782 Mixed hyperlipidemia: Secondary | ICD-10-CM

## 2021-04-27 MED ORDER — ATORVASTATIN CALCIUM 10 MG PO TABS: 10.0000 mg | ORAL_TABLET | Freq: Every day | ORAL | 1 refills | Status: DC

## 2021-04-27 MED ORDER — AMLODIPINE BESYLATE 5 MG PO TABS: ORAL_TABLET | ORAL | 1 refills | Status: DC

## 2021-04-27 MED ORDER — CARBAMAZEPINE 100 MG PO CHEW: CHEWABLE_TABLET | ORAL | 3 refills | Status: DC

## 2021-04-27 NOTE — Progress Notes (Signed)
Pt needs to see ortho if continues to have pain

## 2021-04-27 NOTE — Telephone Encounter (Signed)
error 

## 2021-04-27 NOTE — Telephone Encounter (Signed)
-----   Message from Lavera Guise, MD sent at 04/27/2021 10:29 AM EST ----- Pt needs to see ortho if continues to have pain

## 2021-04-27 NOTE — Telephone Encounter (Signed)
Spoke to pt and informed him of his xray results.  [facility] advised pt is having arthritis pain  and can refer to orthopedics.  Pt would like to be referred to ortho,  pt advised it will be set up and someone will call with appt information

## 2021-04-30 NOTE — Telephone Encounter (Signed)
[facility] sent referral to ortho

## 2021-05-01 ENCOUNTER — Telehealth: Payer: Self-pay

## 2021-05-01 NOTE — Telephone Encounter (Signed)
Ortho surgery referral sent via [facility] to [facility]

## 2021-05-21 ENCOUNTER — Other Ambulatory Visit: Payer: Self-pay

## 2021-05-21 DIAGNOSIS — R300 Dysuria: Secondary | ICD-10-CM

## 2021-05-21 DIAGNOSIS — E782 Mixed hyperlipidemia: Secondary | ICD-10-CM

## 2021-05-21 DIAGNOSIS — I10 Essential (primary) hypertension: Secondary | ICD-10-CM

## 2021-05-21 DIAGNOSIS — M545 Low back pain, unspecified: Secondary | ICD-10-CM

## 2021-05-21 MED ORDER — AMLODIPINE BESYLATE 5 MG PO TABS: ORAL_TABLET | ORAL | 1 refills | Status: DC

## 2021-05-21 MED ORDER — MELOXICAM 15 MG PO TABS: 15.0000 mg | ORAL_TABLET | Freq: Every day | ORAL | 1 refills | Status: DC

## 2021-05-21 MED ORDER — ATORVASTATIN CALCIUM 10 MG PO TABS: 10.0000 mg | ORAL_TABLET | Freq: Every day | ORAL | 1 refills | Status: DC

## 2021-05-26 ENCOUNTER — Encounter: Payer: Self-pay | Admitting: Nurse Practitioner

## 2021-05-26 ENCOUNTER — Telehealth (INDEPENDENT_AMBULATORY_CARE_PROVIDER_SITE_OTHER): Payer: Medicare Other | Admitting: Nurse Practitioner

## 2021-05-26 VITALS — Ht 66.0 in | Wt 223.0 lb

## 2021-05-26 DIAGNOSIS — J018 Other acute sinusitis: Secondary | ICD-10-CM | POA: Diagnosis not present

## 2021-05-26 DIAGNOSIS — R051 Acute cough: Secondary | ICD-10-CM

## 2021-05-26 DIAGNOSIS — J45909 Unspecified asthma, uncomplicated: Secondary | ICD-10-CM | POA: Diagnosis not present

## 2021-05-26 MED ORDER — LEVOFLOXACIN 750 MG PO TABS: 750.0000 mg | ORAL_TABLET | Freq: Every day | ORAL | 0 refills | Status: AC

## 2021-05-26 MED ORDER — HYDROCOD POLI-CHLORPHE POLI ER 10-8 MG/5ML PO SUER: 5.0000 mL | Freq: Two times a day (BID) | ORAL | 0 refills | Status: DC | PRN

## 2021-05-26 MED ORDER — PREDNISONE 10 MG PO TABS: ORAL_TABLET | ORAL | 0 refills | Status: DC

## 2021-05-26 NOTE — Progress Notes (Signed)
[facility] ?[address] ?[place],  [address] ? ?Internal MEDICINE  ?Telephone Visit ? ?Patient Name: Tony Collier ? [id number]  ?[id number] ? ?Date of Service: 05/26/2021 ? ?I connected with the patient at 12:30 PM by telephone and verified the patients identity using two identifiers.   ?I discussed the limitations, risks, security and privacy concerns of performing an evaluation and management service by telephone and the availability of in person appointments. I also discussed with the patient that there may be a patient responsible charge related to the service.  The patient expressed understanding and agrees to proceed.   ? ?Chief Complaint  ?Patient presents with  ? Telephone Assessment  ?  [id number]  ? Telephone Screen  ?  Covid test is negative Going on for few days   ? Cough  ?  Dry   ? Sinusitis  ? ? ?HPI ?Tony Collier presents for a telehealth virtual visit for symptoms of sinusitis and a dry cough.  He tested negative for COVID.  His symptoms started a few days ago.  He reports nasal congestion, sinus drainage runny nose, sinus pressure, sore throat, dry cough, shortness of breath and wheezing with chest tightness, headaches and fatigue. ? ? ?Current Medication: ?Outpatient Encounter Medications as of 05/26/2021  ?Medication Sig  ? albuterol (VENTOLIN HFA) 108 (90 Base) MCG/ACT inhaler Inhale 2 puffs into the lungs every 6 (six) hours as needed for wheezing or shortness of breath.  ? amLODipine (NORVASC) 5 MG tablet Take one tab a day for BP  ? atorvastatin (LIPITOR) 10 MG tablet Take 1 tablet (10 mg total) by mouth daily.  ? carbamazepine (TEGRETOL) 100 MG chewable tablet TAKE 4 TABLETS IN THE MORNING AND 4 TABLETS IN THE EVENING.  ? chlorpheniramine-HYDROcodone (TUSSIONEX PENNKINETIC ER) 10-8 MG/5ML Take 5 mLs by mouth every 12 (twelve) hours as needed for cough.  ? cyclobenzaprine (FLEXERIL) 10 MG tablet Take 1 tablet (10 mg total) by mouth at bedtime. Take one tab po qhs for back spasm  prn only  ? hydrochlorothiazide (HYDRODIURIL) 25 MG tablet Take 1 tablet (25 mg total) by mouth daily.  ? levofloxacin (LEVAQUIN) 750 MG tablet Take 1 tablet (750 mg total) by mouth daily for 10 days.  ? meloxicam (MOBIC) 15 MG tablet Take 1 tablet (15 mg total) by mouth daily.  ? predniSONE (DELTASONE) 10 MG tablet Take one tab 3 x day for 3 days, then take one tab 2 x a day for 3 days and then take one tab a day for 3 days for copd  ? sildenafil (VIAGRA) 100 MG tablet Take 1 tablet (100 mg total) by mouth daily as needed for erectile dysfunction.  ? traZODone (DESYREL) 100 MG tablet Take 1 tablet (100 mg total) by mouth at bedtime as needed for sleep.  ? [DISCONTINUED] predniSONE (DELTASONE) 10 MG tablet Take one tab 3 x day for 3 days, then take one tab 2 x a day for 3 days and then take one tab a day for 3 days for copd  ? [DISCONTINUED] promethazine-dextromethorphan (PROMETHAZINE-DM) 6.25-15 MG/5ML syrup Take 5 mLs by mouth 4 (four) times daily as needed for cough.  ? ?No facility-administered encounter medications on file as of 05/26/2021.  ? ? ?Surgical History: ?Past Surgical History:  ?Procedure Laterality Date  ? COLONOSCOPY N/A 09/12/2020  ? Procedure: COLONOSCOPY;  Surgeon: Virgel Manifold, MD;  Location[facility] ENDOSCOPY;  Service: Endoscopy;  Laterality: N/A;  ? FOOT SURGERY    ? ? ?Medical History: ?  Past Medical History:  ?Diagnosis Date  ? Hypertension   ? Insomnia   ? Seizures ([place])   ? Seizures ([place])   ? ? ?Family History: ?Family History  ?Problem Relation Age of Onset  ? Hypertension Mother   ? ? ?Social History  ? ?Socioeconomic History  ? Marital status: Married  ?  Spouse name: Not on file  ? Number of children: Not on file  ? Years of education: Not on file  ? Highest education level: Not on file  ?Occupational History  ? Not on file  ?Tobacco Use  ? Smoking status: Former  ?  Types: Cigarettes  ?  Quit date: 12/08/2017  ?  Years since quitting: 3.4  ? Smokeless tobacco: Never  ?Vaping Use  ?  Vaping Use: Never used  ?Substance and Sexual Activity  ? Alcohol use: Yes  ?  Comment: ocassional  ? Drug use: Never  ? Sexual activity: Not on file  ?Other Topics Concern  ? Not on file  ?Social History Narrative  ? Not on file  ? ?Social Determinants of Health  ? ?Financial Resource Strain: Not on file  ?Food Insecurity: Not on file  ?Transportation Needs: Not on file  ?Physical Activity: Not on file  ?Stress: Not on file  ?Social Connections: Not on file  ?Intimate Partner Violence: Not on file  ? ? ? ? ?Review of Systems  ?Constitutional:  Positive for fatigue. Negative for chills and fever.  ?HENT:  Positive for congestion, postnasal drip, rhinorrhea, sinus pressure, sinus pain, sneezing and sore throat. Negative for ear pain.   ?Eyes:  Negative for pain.  ?Respiratory:  Positive for cough, chest tightness, shortness of breath and wheezing.   ?Cardiovascular: Negative.  Negative for chest pain and palpitations.  ?Gastrointestinal: Negative.  Negative for abdominal pain, constipation, diarrhea, nausea and vomiting.  ?Musculoskeletal:  Positive for myalgias.  ?Neurological:  Positive for headaches.  ? ?Vital Signs: ?Ht '5\' 6"'$  (1.676 m)   Wt 223 lb (101.2 kg)   BMI 35.99 kg/m?  ? ? ?Observation/Objective: ?Patient is alert and oriented and engages in conversation appropriately.  He does not appear to be in any acute distress over video call.  When speaking does sound nasal like his nose is stopped up and congested ? ? ? ?Assessment/Plan: ?1. Acute non-recurrent sinusitis of other sinus ?Empiric antibiotic treatment prescribed ?- levofloxacin (LEVAQUIN) 750 MG tablet; Take 1 tablet (750 mg total) by mouth daily for 10 days.  Dispense: 10 tablet; Refill: 0 ? ?2. Acute asthmatic bronchitis ?Antibiotic prescribed as well as a prednisone taper to decrease inflammation and wheezing. ?- predniSONE (DELTASONE) 10 MG tablet; Take one tab 3 x day for 3 days, then take one tab 2 x a day for 3 days and then take one tab a  day for 3 days for copd  Dispense: 18 tablet; Refill: 0 ?- levofloxacin (LEVAQUIN) 750 MG tablet; Take 1 tablet (750 mg total) by mouth daily for 10 days.  Dispense: 10 tablet; Refill: 0 ? ?3. Acute cough ?Medication prescribed for the symptomatic relief of cough ?- chlorpheniramine-HYDROcodone (TUSSIONEX PENNKINETIC ER) 10-8 MG/5ML; Take 5 mLs by mouth every 12 (twelve) hours as needed for cough.  Dispense: 200 mL; Refill: 0 ? ? ?General Counseling: Tony Collier understanding of the findings of today's phone visit and agrees with plan of treatment. I have discussed any further diagnostic evaluation that may be needed or ordered today. We also reviewed his medications today. he has been encouraged to  call the office with any questions or concerns that should arise related to todays visit. ? ?Return if symptoms worsen or fail to improve. ? ? ?No orders of the defined types were placed in this encounter. ? ? ?Meds ordered this encounter  ?Medications  ? chlorpheniramine-HYDROcodone (TUSSIONEX PENNKINETIC ER) 10-8 MG/5ML  ?  Sig: Take 5 mLs by mouth every 12 (twelve) hours as needed for cough.  ?  Dispense:  200 mL  ?  Refill:  0  ? predniSONE (DELTASONE) 10 MG tablet  ?  Sig: Take one tab 3 x day for 3 days, then take one tab 2 x a day for 3 days and then take one tab a day for 3 days for copd  ?  Dispense:  18 tablet  ?  Refill:  0  ? levofloxacin (LEVAQUIN) 750 MG tablet  ?  Sig: Take 1 tablet (750 mg total) by mouth daily for 10 days.  ?  Dispense:  10 tablet  ?  Refill:  0  ? ? ?Time spent: 30 minutes ?Time spent with patient included reviewing progress notes, labs, imaging studies, and discussing plan for follow up.  ?[place] Controlled Substance Database was reviewed by me for overdose risk score (ORS) if appropriate. ? ?This patient was seen by Jonetta Osgood, FNP-C in collaboration with Dr. Clayborn Bigness as a part of collaborative care agreement. ? ?Kindsey Eblin R. Valetta Fuller, MSN, FNP-C ?Internal medicine  ?

## 2021-06-16 NOTE — Telephone Encounter (Signed)
Per Nira Conn w/ [facility], referral closed due to patient not returning calls to schedule-Toni ?

## 2021-07-10 ENCOUNTER — Encounter: Payer: Self-pay | Admitting: Nurse Practitioner

## 2021-07-10 ENCOUNTER — Telehealth (INDEPENDENT_AMBULATORY_CARE_PROVIDER_SITE_OTHER): Payer: Medicare Other | Admitting: Nurse Practitioner

## 2021-07-10 VITALS — HR 66 | Resp 16 | Ht 66.0 in | Wt 220.0 lb

## 2021-07-10 DIAGNOSIS — R051 Acute cough: Secondary | ICD-10-CM

## 2021-07-10 DIAGNOSIS — J0181 Other acute recurrent sinusitis: Secondary | ICD-10-CM

## 2021-07-10 DIAGNOSIS — J45909 Unspecified asthma, uncomplicated: Secondary | ICD-10-CM | POA: Diagnosis not present

## 2021-07-10 MED ORDER — AMOXICILLIN-POT CLAVULANATE 875-125 MG PO TABS: 1.0000 | ORAL_TABLET | Freq: Two times a day (BID) | ORAL | 0 refills | Status: DC

## 2021-07-10 MED ORDER — HYDROCOD POLI-CHLORPHE POLI ER 10-8 MG/5ML PO SUER: 5.0000 mL | Freq: Two times a day (BID) | ORAL | 0 refills | Status: DC | PRN

## 2021-07-10 MED ORDER — PREDNISONE 10 MG PO TABS: ORAL_TABLET | ORAL | 0 refills | Status: DC

## 2021-07-10 NOTE — Progress Notes (Signed)
[facility] ?[address] ?[place], [place] [address] ? ?Internal MEDICINE  ?Telephone Visit ? ?Patient Name: Tony Collier ? [id number]  ?[id number] ? ?Date of Service: 07/10/2021 ? ?I connected with the patient at 12:32 PM by telephone and verified the patients identity using two identifiers.   ?I discussed the limitations, risks, security and privacy concerns of performing an evaluation and management service by telephone and the availability of in person appointments. I also discussed with the patient that there may be a patient responsible charge related to the service.  The patient expressed understanding and agrees to proceed.   ? ?Chief Complaint  ?Patient presents with  ? Acute Visit  ? Telephone Assessment  ?  Video call   ? Telephone Screen  ?  [id number]   ? Cough  ?  For 1 week, wheezing, chest tight, chest congestion, nose is stuffy   ? Medication Refill  ? ? ?Cough ?Associated symptoms include postnasal drip, rhinorrhea, a sore throat, shortness of breath and wheezing. Pertinent negatives include no chest pain, chills, fever or myalgias.  ?Medication Refill ?Associated symptoms include congestion, coughing and a sore throat. Pertinent negatives include no abdominal pain, chest pain, chills, diaphoresis, fatigue, fever, myalgias, nausea or vomiting.  ?Nahom presents for a telehealth virtual visit for cough x1 week.  He also reports having wheezing, chest tightness, chest congestion, nasal congestion, runny nose.  He denies having any exposure to COVID.  He has been having recurrent upper respiratory infections and issues.  He was treated approximately 1 month ago for an upper respiratory infection with levofloxacin. ? ? ?Current Medication: ?Outpatient Encounter Medications as of 07/10/2021  ?Medication Sig  ? albuterol (VENTOLIN HFA) 108 (90 Base) MCG/ACT inhaler Inhale 2 puffs into the lungs every 6 (six) hours as needed for wheezing or shortness of breath.  ? amLODipine (NORVASC) 5 MG  tablet Take one tab a day for BP  ? amoxicillin-clavulanate (AUGMENTIN) 875-125 MG tablet Take 1 tablet by mouth 2 (two) times daily.  ? atorvastatin (LIPITOR) 10 MG tablet Take 1 tablet (10 mg total) by mouth daily.  ? carbamazepine (TEGRETOL) 100 MG chewable tablet TAKE 4 TABLETS IN THE MORNING AND 4 TABLETS IN THE EVENING.  ? cyclobenzaprine (FLEXERIL) 10 MG tablet Take 1 tablet (10 mg total) by mouth at bedtime. Take one tab po qhs for back spasm prn only  ? hydrochlorothiazide (HYDRODIURIL) 25 MG tablet Take 1 tablet (25 mg total) by mouth daily.  ? meloxicam (MOBIC) 15 MG tablet Take 1 tablet (15 mg total) by mouth daily.  ? predniSONE (DELTASONE) 10 MG tablet Take one tab 3 x day for 3 days, then take one tab 2 x a day for 3 days and then take one tab a day for 3 days for copd  ? sildenafil (VIAGRA) 100 MG tablet Take 1 tablet (100 mg total) by mouth daily as needed for erectile dysfunction.  ? traZODone (DESYREL) 100 MG tablet Take 1 tablet (100 mg total) by mouth at bedtime as needed for sleep.  ? [DISCONTINUED] chlorpheniramine-HYDROcodone (TUSSIONEX PENNKINETIC ER) 10-8 MG/5ML Take 5 mLs by mouth every 12 (twelve) hours as needed for cough.  ? [DISCONTINUED] predniSONE (DELTASONE) 10 MG tablet Take one tab 3 x day for 3 days, then take one tab 2 x a day for 3 days and then take one tab a day for 3 days for copd  ? chlorpheniramine-HYDROcodone (TUSSIONEX PENNKINETIC ER) 10-8 MG/5ML Take 5 mLs by mouth every 12 (twelve)  hours as needed for cough.  ? ?No facility-administered encounter medications on file as of 07/10/2021.  ? ? ?Surgical History: ?Past Surgical History:  ?Procedure Laterality Date  ? COLONOSCOPY N/A 09/12/2020  ? Procedure: COLONOSCOPY;  Surgeon: Virgel Manifold, MD;  Location[facility] ENDOSCOPY;  Service: Endoscopy;  Laterality: N/A;  ? FOOT SURGERY    ? ? ?Medical History: ?Past Medical History:  ?Diagnosis Date  ? Hypertension   ? Insomnia   ? Seizures ([place])   ? Seizures ([place])    ? ? ?Family History: ?Family History  ?Problem Relation Age of Onset  ? Hypertension Mother   ? ? ?Social History  ? ?Socioeconomic History  ? Marital status: Married  ?  Spouse name: Not on file  ? Number of children: Not on file  ? Years of education: Not on file  ? Highest education level: Not on file  ?Occupational History  ? Not on file  ?Tobacco Use  ? Smoking status: Former  ?  Types: Cigarettes  ?  Quit date: 12/08/2017  ?  Years since quitting: 3.5  ? Smokeless tobacco: Never  ?Vaping Use  ? Vaping Use: Never used  ?Substance and Sexual Activity  ? Alcohol use: Yes  ?  Comment: ocassional  ? Drug use: Never  ? Sexual activity: Not on file  ?Other Topics Concern  ? Not on file  ?Social History Narrative  ? Not on file  ? ?Social Determinants of Health  ? ?Financial Resource Strain: Not on file  ?Food Insecurity: Not on file  ?Transportation Needs: Not on file  ?Physical Activity: Not on file  ?Stress: Not on file  ?Social Connections: Not on file  ?Intimate Partner Violence: Not on file  ? ? ? ? ?Review of Systems  ?Constitutional:  Negative for chills, diaphoresis, fatigue and fever.  ?HENT:  Positive for congestion, postnasal drip, rhinorrhea, sinus pressure, sinus pain, sneezing and sore throat.   ?Respiratory:  Positive for cough, chest tightness, shortness of breath and wheezing.   ?Cardiovascular: Negative.  Negative for chest pain and palpitations.  ?Gastrointestinal:  Negative for abdominal pain, constipation, diarrhea, nausea and vomiting.  ?Musculoskeletal:  Negative for myalgias.  ? ?Vital Signs: ?Pulse 66   Resp 16   Ht '5\' 6"'$  (1.676 m)   Wt 220 lb (99.8 kg)   BMI 35.51 kg/m?  ? ? ?Observation/Objective: ?He is alert and oriented and engages in conversation appropriately. ?He does not seem to be in any acute distress over telephone call. ? ? ? ?Assessment/Plan: ?1. Other acute recurrent sinusitis ?Empiric antibiotic treatment prescribed.  Consider ENT referral if symptoms worsen, relapse or  recur. ?- amoxicillin-clavulanate (AUGMENTIN) 875-125 MG tablet; Take 1 tablet by mouth 2 (two) times daily.  Dispense: 20 tablet; Refill: 0 ? ?2. Acute asthmatic bronchitis ?Prednisone taper prescribed to decrease inflammation and wheezing ?- predniSONE (DELTASONE) 10 MG tablet; Take one tab 3 x day for 3 days, then take one tab 2 x a day for 3 days and then take one tab a day for 3 days for copd  Dispense: 18 tablet; Refill: 0 ? ?3. Acute cough ?Medication prescribed for symptomatic relief of cough ?- chlorpheniramine-HYDROcodone (TUSSIONEX PENNKINETIC ER) 10-8 MG/5ML; Take 5 mLs by mouth every 12 (twelve) hours as needed for cough.  Dispense: 200 mL; Refill: 0 ? ? ?General Counseling: travarius lange understanding of the findings of today's phone visit and agrees with plan of treatment. I have discussed any further diagnostic evaluation that may be needed  or ordered today. We also reviewed his medications today. he has been encouraged to call the office with any questions or concerns that should arise related to todays visit. ? ?Return if symptoms worsen or fail to improve. ? ? ?No orders of the defined types were placed in this encounter. ? ? ?Meds ordered this encounter  ?Medications  ? predniSONE (DELTASONE) 10 MG tablet  ?  Sig: Take one tab 3 x day for 3 days, then take one tab 2 x a day for 3 days and then take one tab a day for 3 days for copd  ?  Dispense:  18 tablet  ?  Refill:  0  ? amoxicillin-clavulanate (AUGMENTIN) 875-125 MG tablet  ?  Sig: Take 1 tablet by mouth 2 (two) times daily.  ?  Dispense:  20 tablet  ?  Refill:  0  ? chlorpheniramine-HYDROcodone (TUSSIONEX PENNKINETIC ER) 10-8 MG/5ML  ?  Sig: Take 5 mLs by mouth every 12 (twelve) hours as needed for cough.  ?  Dispense:  200 mL  ?  Refill:  0  ? ? ?Time spent:10 Minutes ?Time spent with patient included reviewing progress notes, labs, imaging studies, and discussing plan for follow up.  ?[place] Controlled Substance Database was reviewed by  me for overdose risk score (ORS) if appropriate. ? ?This patient was seen by Jonetta Osgood, FNP-C in collaboration with Dr. Clayborn Bigness as a part of collaborative care agreement. ? ?Clairissa Valvano R. Valetta Fuller, MSN, FNP-C ?I

## 2021-07-15 ENCOUNTER — Telehealth: Payer: Self-pay

## 2021-07-15 NOTE — Telephone Encounter (Signed)
lvm letting pt know appt moved from 5/12 to 5/3-Toni ?

## 2021-07-21 ENCOUNTER — Other Ambulatory Visit: Payer: Self-pay

## 2021-07-21 DIAGNOSIS — I10 Essential (primary) hypertension: Secondary | ICD-10-CM

## 2021-07-21 DIAGNOSIS — G40909 Epilepsy, unspecified, not intractable, without status epilepticus: Secondary | ICD-10-CM

## 2021-07-21 MED ORDER — CARBAMAZEPINE 100 MG PO CHEW: CHEWABLE_TABLET | ORAL | 3 refills | Status: DC

## 2021-07-21 MED ORDER — HYDROCHLOROTHIAZIDE 25 MG PO TABS: 25.0000 mg | ORAL_TABLET | Freq: Every day | ORAL | 3 refills | Status: DC

## 2021-07-21 MED ORDER — AMLODIPINE BESYLATE 5 MG PO TABS: ORAL_TABLET | ORAL | 1 refills | Status: DC

## 2021-07-24 ENCOUNTER — Encounter: Payer: Medicare Other | Admitting: Nurse Practitioner

## 2021-07-28 ENCOUNTER — Telehealth: Payer: Self-pay

## 2021-07-28 NOTE — Telephone Encounter (Signed)
07/14/21.Marland KitchenSpoke to pt's wife and asked about the PA for CARBAMAZEPINE 100 mg Tab asking if we need to continue the PA process and his wife advised that we can disregard the request that they are getting the medication  ?

## 2021-08-05 ENCOUNTER — Telehealth: Payer: Self-pay

## 2021-08-05 NOTE — Telephone Encounter (Signed)
Left vm and sent mychart message to confirm 08/12/21 appointment-Toni

## 2021-08-12 ENCOUNTER — Ambulatory Visit
Admission: RE | Admit: 2021-08-12 | Discharge: 2021-08-12 | Disposition: A | Discharge status: Home / Self Care | Payer: Medicare Other | Source: Ambulatory Visit | Attending: Nurse Practitioner | Admitting: Nurse Practitioner

## 2021-08-12 ENCOUNTER — Encounter: Payer: Self-pay | Admitting: Nurse Practitioner

## 2021-08-12 ENCOUNTER — Ambulatory Visit
Admission: RE | Admit: 2021-08-12 | Discharge: 2021-08-12 | Disposition: A | Discharge status: Home / Self Care | Payer: Medicare Other | Attending: Nurse Practitioner | Admitting: Nurse Practitioner

## 2021-08-12 ENCOUNTER — Ambulatory Visit (INDEPENDENT_AMBULATORY_CARE_PROVIDER_SITE_OTHER): Payer: Medicare Other | Admitting: Nurse Practitioner

## 2021-08-12 VITALS — BP 128/82 | HR 75 | Temp 98.1°F | Resp 16 | Ht 66.0 in | Wt 226.2 lb

## 2021-08-12 DIAGNOSIS — I10 Essential (primary) hypertension: Secondary | ICD-10-CM

## 2021-08-12 DIAGNOSIS — Z0001 Encounter for general adult medical examination with abnormal findings: Secondary | ICD-10-CM | POA: Diagnosis not present

## 2021-08-12 DIAGNOSIS — R053 Chronic cough: Secondary | ICD-10-CM

## 2021-08-12 DIAGNOSIS — J329 Chronic sinusitis, unspecified: Secondary | ICD-10-CM | POA: Diagnosis not present

## 2021-08-12 DIAGNOSIS — R0602 Shortness of breath: Secondary | ICD-10-CM | POA: Diagnosis not present

## 2021-08-12 DIAGNOSIS — R300 Dysuria: Secondary | ICD-10-CM

## 2021-08-12 DIAGNOSIS — R0689 Other abnormalities of breathing: Secondary | ICD-10-CM | POA: Diagnosis not present

## 2021-08-12 DIAGNOSIS — E538 Deficiency of other specified B group vitamins: Secondary | ICD-10-CM

## 2021-08-12 DIAGNOSIS — E782 Mixed hyperlipidemia: Secondary | ICD-10-CM

## 2021-08-12 DIAGNOSIS — R051 Acute cough: Secondary | ICD-10-CM

## 2021-08-12 DIAGNOSIS — E559 Vitamin D deficiency, unspecified: Secondary | ICD-10-CM

## 2021-08-12 MED ORDER — HYDROCOD POLI-CHLORPHE POLI ER 10-8 MG/5ML PO SUER: 5.0000 mL | Freq: Two times a day (BID) | ORAL | 0 refills | Status: DC | PRN

## 2021-08-12 MED ORDER — ATORVASTATIN CALCIUM 10 MG PO TABS: 10.0000 mg | ORAL_TABLET | Freq: Every day | ORAL | 1 refills | Status: DC

## 2021-08-12 NOTE — Progress Notes (Signed)
[facility] [place],  [address]  Internal MEDICINE  Office Visit Note  Patient Name: Tony Collier  [id number]  [id number]  Date of Service: 08/12/2021  Chief Complaint  Patient presents with  . Medicare Wellness  . Hypertension  . Cough    Noticed over the last month     HPI Tony Collier presents for an annual well visit and physical exam. He is a well appearing 46 yo male with hypertension, seizure disorder and hyperlipidemia. He had a colonoscopy in July last year and is not due again until 2032. His weight and other vital signs are stable and within normal limits.  He has noticed a cough develop over the past month. He also has frequent sinus infections. He has not seen ENT yet. He is due for routine labs.  He has no other concerns. He denies any new or worsening pain.     Current Medication: Outpatient Encounter Medications as of 08/12/2021  Medication Sig  . albuterol (VENTOLIN HFA) 108 (90 Base) MCG/ACT inhaler Inhale 2 puffs into the lungs every 6 (six) hours as needed for wheezing or shortness of breath.  Marland Kitchen amLODipine (NORVASC) 5 MG tablet Take one tab a day for BP  . carbamazepine (TEGRETOL) 100 MG chewable tablet TAKE 4 TABLETS IN THE MORNING AND 4 TABLETS IN THE EVENING.  . cyclobenzaprine (FLEXERIL) 10 MG tablet Take 1 tablet (10 mg total) by mouth at bedtime. Take one tab po qhs for back spasm prn only  . hydrochlorothiazide (HYDRODIURIL) 25 MG tablet Take 1 tablet (25 mg total) by mouth daily.  . meloxicam (MOBIC) 15 MG tablet Take 1 tablet (15 mg total) by mouth daily.  . sildenafil (VIAGRA) 100 MG tablet Take 1 tablet (100 mg total) by mouth daily as needed for erectile dysfunction.  . traZODone (DESYREL) 100 MG tablet Take 1 tablet (100 mg total) by mouth at bedtime as needed for sleep.  . [DISCONTINUED] amoxicillin-clavulanate (AUGMENTIN) 875-125 MG tablet Take 1 tablet by mouth 2 (two) times daily.  . [DISCONTINUED] atorvastatin  (LIPITOR) 10 MG tablet Take 1 tablet (10 mg total) by mouth daily.  . [DISCONTINUED] chlorpheniramine-HYDROcodone (TUSSIONEX PENNKINETIC ER) 10-8 MG/5ML Take 5 mLs by mouth every 12 (twelve) hours as needed for cough.  Marland Kitchen atorvastatin (LIPITOR) 10 MG tablet Take 1 tablet (10 mg total) by mouth daily.  . chlorpheniramine-HYDROcodone (TUSSIONEX PENNKINETIC ER) 10-8 MG/5ML Take 5 mLs by mouth every 12 (twelve) hours as needed for cough.  . [DISCONTINUED] predniSONE (DELTASONE) 10 MG tablet Take one tab 3 x day for 3 days, then take one tab 2 x a day for 3 days and then take one tab a day for 3 days for copd (Patient not taking: Reported on 08/12/2021)   No facility-administered encounter medications on file as of 08/12/2021.    Surgical History: Past Surgical History:  Procedure Laterality Date  . COLONOSCOPY N/A 09/12/2020   Procedure: COLONOSCOPY;  Surgeon: Virgel Manifold, MD;  Location: [facility] ENDOSCOPY;  Service: Endoscopy;  Laterality: N/A;  . FOOT SURGERY      Medical History: Past Medical History:  Diagnosis Date  . Hypertension   . Insomnia   . Seizures ([place])   . Seizures ([place])     Family History: Family History  Problem Relation Age of Onset  . Hypertension Mother     Social History   Socioeconomic History  . Marital status: Married    Spouse name: Not on file  . Number of  children: Not on file  . Years of education: Not on file  . Highest education level: Not on file  Occupational History  . Not on file  Tobacco Use  . Smoking status: Former    Types: Cigarettes    Quit date: 12/08/2017    Years since quitting: 3.6  . Smokeless tobacco: Never  Vaping Use  . Vaping Use: Never used  Substance and Sexual Activity  . Alcohol use: Yes    Comment: ocassional  . Drug use: Never  . Sexual activity: Not on file  Other Topics Concern  . Not on file  Social History Narrative  . Not on file   Social Determinants of Health   Financial Resource Strain: Not on file   Food Insecurity: Not on file  Transportation Needs: Not on file  Physical Activity: Not on file  Stress: Not on file  Social Connections: Not on file  Intimate Partner Violence: Not on file      Review of Systems  Constitutional:  Negative for activity change, appetite change, chills, fatigue, fever and unexpected weight change.  HENT: Negative.  Negative for congestion, ear pain, rhinorrhea, sore throat and trouble swallowing.   Eyes: Negative.   Respiratory:  Positive for cough. Negative for chest tightness, shortness of breath and wheezing.   Cardiovascular: Negative.  Negative for chest pain and palpitations.  Gastrointestinal: Negative.  Negative for abdominal pain, blood in stool, constipation, diarrhea, nausea and vomiting.  Endocrine: Negative.   Genitourinary: Negative.  Negative for difficulty urinating, dysuria, frequency, hematuria and urgency.  Musculoskeletal: Negative.  Negative for arthralgias, back pain, joint swelling, myalgias and neck pain.  Skin: Negative.  Negative for rash and wound.  Allergic/Immunologic: Negative.  Negative for immunocompromised state.  Neurological: Negative.  Negative for dizziness, seizures, numbness and headaches.  Hematological: Negative.   Psychiatric/Behavioral: Negative.  Negative for behavioral problems, self-injury and suicidal ideas. The patient is not nervous/anxious.     Vital Signs: BP 128/82   Pulse 75   Temp 98.1 F (36.7 C)   Resp 16   Ht $R'5\' 6"'eV$  (1.676 m)   Wt 226 lb 3.2 oz (102.6 kg)   SpO2 97%   BMI 36.51 kg/m    Physical Exam Vitals reviewed.  Constitutional:      General: He is not in acute distress.    Appearance: Normal appearance. He is well-developed. He is obese. He is not ill-appearing or diaphoretic.  HENT:     Head: Normocephalic and atraumatic.     Right Ear: Tympanic membrane, ear canal and external ear normal.     Left Ear: Tympanic membrane, ear canal and external ear normal.     Nose: Nose  normal.     Mouth/Throat:     Mouth: Mucous membranes are moist.     Pharynx: Oropharynx is clear. No oropharyngeal exudate.  Eyes:     General: No scleral icterus.       Right eye: No discharge.        Left eye: No discharge.     Conjunctiva/sclera: Conjunctivae normal.     Pupils: Pupils are equal, round, and reactive to light.  Neck:     Thyroid: No thyromegaly.     Vascular: No JVD.     Trachea: No tracheal deviation.  Cardiovascular:     Rate and Rhythm: Normal rate and regular rhythm.     Pulses: Normal pulses.     Heart sounds: Normal heart sounds. No murmur heard.  No friction rub. No gallop.  Pulmonary:     Effort: Pulmonary effort is normal. No respiratory distress.     Breath sounds: Normal breath sounds. No stridor. No wheezing or rales.  Chest:     Chest wall: No tenderness.  Abdominal:     General: Bowel sounds are normal. There is no distension.     Palpations: Abdomen is soft. There is no mass.     Tenderness: There is no abdominal tenderness. There is no guarding or rebound.  Musculoskeletal:        General: No tenderness or deformity. Normal range of motion.     Cervical back: Normal range of motion and neck supple.  Lymphadenopathy:     Cervical: No cervical adenopathy.  Skin:    General: Skin is warm and dry.     Coloration: Skin is not pale.     Findings: No erythema or rash.  Neurological:     Mental Status: He is alert.     Cranial Nerves: No cranial nerve deficit.     Motor: No abnormal muscle tone.     Coordination: Coordination normal.     Deep Tendon Reflexes: Reflexes are normal and symmetric.  Psychiatric:        Behavior: Behavior normal.        Thought Content: Thought content normal.        Judgment: Judgment normal.       Assessment/Plan: 1. Dysuria - UA/M w/rflx Culture, Routine - CBC with Differential/Platelet - CMP14+EGFR - TSH + free T4  2. Mixed hyperlipidemia - atorvastatin (LIPITOR) 10 MG tablet; Take 1 tablet (10  mg total) by mouth daily.  Dispense: 90 tablet; Refill: 1 - CBC with Differential/Platelet - CMP14+EGFR - Lipid Profile - TSH + free T4  3. Acute cough - chlorpheniramine-HYDROcodone (TUSSIONEX PENNKINETIC ER) 10-8 MG/5ML; Take 5 mLs by mouth every 12 (twelve) hours as needed for cough.  Dispense: 200 mL; Refill: 0 - CBC with Differential/Platelet - CMP14+EGFR - TSH + free T4  4. Persistent cough for 3 weeks or longer - CBC with Differential/Platelet - CMP14+EGFR - TSH + free T4 - DG Chest 2 View; Future  5. Essential hypertension - CBC with Differential/Platelet - CMP14+EGFR - TSH + free T4  6. Recurrent rhinosinusitis - CBC with Differential/Platelet - CMP14+EGFR - TSH + free T4 - DG Chest 2 View; Future  7. Encounter for routine adult health examination with abnormal findings - CBC with Differential/Platelet - CMP14+EGFR - TSH + free T4  8. Vitamin D deficiency - CBC with Differential/Platelet - CMP14+EGFR - Vitamin D (25 hydroxy) - TSH + free T4  9. B12 deficiency - CBC with Differential/Platelet - CMP14+EGFR - B12 and Folate Panel - TSH + free T4  10. SOB (shortness of breath) on exertion - DG Chest 2 View; Future  11. Decreased breath sounds at left lung base - DG Chest 2 View; Future     General Counseling: Casimiro Needle understanding of the findings of todays visit and agrees with plan of treatment. I have discussed any further diagnostic evaluation that may be needed or ordered today. We also reviewed his medications today. he has been encouraged to call the office with any questions or concerns that should arise related to todays visit.    Orders Placed This Encounter  Procedures  . DG Chest 2 View  . UA/M w/rflx Culture, Routine  . CBC with Differential/Platelet  . CMP14+EGFR  . Lipid Profile  . B12 and Folate Panel  .  Vitamin D (25 hydroxy)  . TSH + free T4    Meds ordered this encounter  Medications  . atorvastatin (LIPITOR)  10 MG tablet    Sig: Take 1 tablet (10 mg total) by mouth daily.    Dispense:  90 tablet    Refill:  1    For future refills  . chlorpheniramine-HYDROcodone (TUSSIONEX PENNKINETIC ER) 10-8 MG/5ML    Sig: Take 5 mLs by mouth every 12 (twelve) hours as needed for cough.    Dispense:  200 mL    Refill:  0    Return in about 3 months (around 11/12/2021) for F/U, Tomekia Helton PCP.   Total time spent:*** Minutes Time spent includes review of chart, medications, test results, and follow up plan with the patient.   [place] Controlled Substance Database was reviewed by me.  This patient was seen by Jonetta Osgood, FNP-C in collaboration with Dr. Clayborn Bigness as a part of collaborative care agreement.  Kya Mayfield R. Valetta Fuller, MSN, FNP-C Internal medicine

## 2021-08-13 ENCOUNTER — Other Ambulatory Visit
Admission: RE | Admit: 2021-08-13 | Discharge: 2021-08-13 | Disposition: A | Discharge status: Home / Self Care | Payer: Medicare Other | Attending: Nurse Practitioner | Admitting: Nurse Practitioner

## 2021-08-13 DIAGNOSIS — E782 Mixed hyperlipidemia: Secondary | ICD-10-CM | POA: Insufficient documentation

## 2021-08-13 DIAGNOSIS — R053 Chronic cough: Secondary | ICD-10-CM | POA: Diagnosis not present

## 2021-08-13 DIAGNOSIS — J329 Chronic sinusitis, unspecified: Secondary | ICD-10-CM | POA: Insufficient documentation

## 2021-08-13 DIAGNOSIS — E538 Deficiency of other specified B group vitamins: Secondary | ICD-10-CM | POA: Insufficient documentation

## 2021-08-13 DIAGNOSIS — E559 Vitamin D deficiency, unspecified: Secondary | ICD-10-CM | POA: Insufficient documentation

## 2021-08-13 DIAGNOSIS — Z0001 Encounter for general adult medical examination with abnormal findings: Secondary | ICD-10-CM | POA: Insufficient documentation

## 2021-08-13 DIAGNOSIS — I10 Essential (primary) hypertension: Secondary | ICD-10-CM | POA: Diagnosis not present

## 2021-08-13 DIAGNOSIS — R051 Acute cough: Secondary | ICD-10-CM | POA: Diagnosis not present

## 2021-08-13 DIAGNOSIS — R300 Dysuria: Secondary | ICD-10-CM | POA: Insufficient documentation

## 2021-08-13 LAB — CBC WITH DIFFERENTIAL/PLATELET
Abs Immature Granulocytes: 0.01 K/uL (ref 0.00–0.07)
Basophils Absolute: 0 K/uL (ref 0.0–0.1)
Basophils Relative: 0 %
Eosinophils Absolute: 0.1 K/uL (ref 0.0–0.5)
Eosinophils Relative: 1 %
HCT: 44 % (ref 39.0–52.0)
Hemoglobin: 14.7 g/dL (ref 13.0–17.0)
Immature Granulocytes: 0 %
Lymphocytes Relative: 24 %
Lymphs Abs: 1.3 K/uL (ref 0.7–4.0)
MCH: 31.2 pg (ref 26.0–34.0)
MCHC: 33.4 g/dL (ref 30.0–36.0)
MCV: 93.4 fL (ref 80.0–100.0)
Monocytes Absolute: 0.3 K/uL (ref 0.1–1.0)
Monocytes Relative: 5 %
Neutro Abs: 3.9 K/uL (ref 1.7–7.7)
Neutrophils Relative %: 70 %
Platelets: 205 K/uL (ref 150–400)
RBC: 4.71 MIL/uL (ref 4.22–5.81)
RDW: 12.3 % (ref 11.5–15.5)
WBC: 5.6 K/uL (ref 4.0–10.5)
nRBC: 0 % (ref 0.0–0.2)

## 2021-08-13 LAB — UA/M W/RFLX CULTURE, ROUTINE
Bilirubin, UA: NEGATIVE
Glucose, UA: NEGATIVE
Ketones, UA: NEGATIVE
Leukocytes,UA: NEGATIVE
Nitrite, UA: NEGATIVE
RBC, UA: NEGATIVE
Specific Gravity, UA: 1.023 (ref 1.005–1.030)
Urobilinogen, Ur: 0.2 mg/dL (ref 0.2–1.0)
pH, UA: 5.5 (ref 5.0–7.5)

## 2021-08-13 LAB — LIPID PANEL
Cholesterol: 133 mg/dL (ref 0–200)
HDL: 34 mg/dL — ABNORMAL LOW (ref >40)
LDL Cholesterol: 79 mg/dL (ref 0–99)
Total CHOL/HDL Ratio: 3.9 RATIO
Triglycerides: 100 mg/dL (ref <150)
VLDL: 20 mg/dL (ref 0–40)

## 2021-08-13 LAB — VITAMIN B12: Vitamin B-12: 421 pg/mL (ref 180–914)

## 2021-08-13 LAB — COMPREHENSIVE METABOLIC PANEL
ALT: 28 U/L (ref 0–44)
AST: 24 U/L (ref 15–41)
Albumin: 4.4 g/dL (ref 3.5–5.0)
Alkaline Phosphatase: 61 U/L (ref 38–126)
Anion gap: 8 (ref 5–15)
BUN: 10 mg/dL (ref 6–20)
CO2: 26 mmol/L (ref 22–32)
Calcium: 9.3 mg/dL (ref 8.9–10.3)
Chloride: 107 mmol/L (ref 98–111)
Creatinine, Ser: 1.01 mg/dL (ref 0.61–1.24)
GFR, Estimated: >60 mL/min (ref >60)
Glucose, Bld: 104 mg/dL — ABNORMAL HIGH (ref 70–99)
Potassium: 3.6 mmol/L (ref 3.5–5.1)
Sodium: 141 mmol/L (ref 135–145)
Total Bilirubin: 0.5 mg/dL (ref 0.3–1.2)
Total Protein: 8.1 g/dL (ref 6.5–8.1)

## 2021-08-13 LAB — MICROSCOPIC EXAMINATION
Bacteria, UA: NONE SEEN
Casts: NONE SEEN /lpf
Epithelial Cells (non renal): NONE SEEN /hpf (ref 0–10)
RBC, Urine: NONE SEEN /hpf (ref 0–2)
WBC, UA: NONE SEEN /hpf (ref 0–5)

## 2021-08-13 LAB — VITAMIN D 25 HYDROXY (VIT D DEFICIENCY, FRACTURES): Vit D, 25-Hydroxy: 42.88 ng/mL (ref 30–100)

## 2021-08-13 LAB — T4, FREE: Free T4: 0.78 ng/dL (ref 0.61–1.12)

## 2021-08-13 LAB — FOLATE: Folate: 9.5 ng/mL

## 2021-08-13 LAB — TSH: TSH: 0.362 u[IU]/mL (ref 0.350–4.500)

## 2021-08-26 ENCOUNTER — Telehealth: Payer: Self-pay

## 2021-08-27 NOTE — Telephone Encounter (Signed)
Pt.notified

## 2021-09-29 ENCOUNTER — Encounter: Payer: Self-pay | Admitting: Nurse Practitioner

## 2021-10-12 ENCOUNTER — Other Ambulatory Visit: Payer: Self-pay

## 2021-10-12 DIAGNOSIS — I10 Essential (primary) hypertension: Secondary | ICD-10-CM

## 2021-10-12 MED ORDER — HYDROCHLOROTHIAZIDE 25 MG PO TABS: 25.0000 mg | ORAL_TABLET | Freq: Every day | ORAL | 1 refills | Status: DC

## 2021-10-21 ENCOUNTER — Telehealth: Payer: Self-pay

## 2021-10-21 MED ORDER — METRONIDAZOLE 500 MG PO TABS: 500.0000 mg | ORAL_TABLET | Freq: Two times a day (BID) | ORAL | 0 refills | Status: DC

## 2021-10-21 NOTE — Telephone Encounter (Signed)
Per Lauren we sent in Flagyl 500 mg x 7 days to pharmacy for treatment due to partner (wife) was positive for Trich.  Spouse aware of medication being sent to pharmacy

## 2021-10-30 ENCOUNTER — Other Ambulatory Visit: Payer: Self-pay | Admitting: Nurse Practitioner

## 2021-10-30 DIAGNOSIS — R300 Dysuria: Secondary | ICD-10-CM

## 2021-10-30 DIAGNOSIS — M545 Low back pain, unspecified: Secondary | ICD-10-CM

## 2021-11-12 ENCOUNTER — Ambulatory Visit (INDEPENDENT_AMBULATORY_CARE_PROVIDER_SITE_OTHER): Payer: Medicare Other | Admitting: Nurse Practitioner

## 2021-11-12 ENCOUNTER — Encounter: Payer: Self-pay | Admitting: Nurse Practitioner

## 2021-11-12 VITALS — BP 133/80 | HR 80 | Temp 98.3°F | Resp 16 | Ht 66.0 in | Wt 221.0 lb

## 2021-11-12 DIAGNOSIS — I10 Essential (primary) hypertension: Secondary | ICD-10-CM | POA: Diagnosis not present

## 2021-11-12 DIAGNOSIS — G40909 Epilepsy, unspecified, not intractable, without status epilepticus: Secondary | ICD-10-CM

## 2021-11-12 DIAGNOSIS — J018 Other acute sinusitis: Secondary | ICD-10-CM | POA: Diagnosis not present

## 2021-11-12 DIAGNOSIS — R053 Chronic cough: Secondary | ICD-10-CM

## 2021-11-12 MED ORDER — AMOXICILLIN-POT CLAVULANATE 875-125 MG PO TABS: 1.0000 | ORAL_TABLET | Freq: Two times a day (BID) | ORAL | 0 refills | Status: AC

## 2021-11-12 MED ORDER — HYDROCOD POLI-CHLORPHE POLI ER 10-8 MG/5ML PO SUER: 5.0000 mL | Freq: Two times a day (BID) | ORAL | 0 refills | Status: DC | PRN

## 2021-11-12 NOTE — Progress Notes (Signed)
[facility] [place], [place] [address]  Internal MEDICINE  Office Visit Note  Patient Name: Tony Collier  [id number]  [id number]  Date of Service: 11/12/2021  Chief Complaint  Patient presents with   Follow-up   Hypertension   Sinus Problem    Dry nose, cough - started 1 week ago   Quality Metric Gaps    Tetanus Vaccine    HPI Tony Collier presents for a follow up visit for symptoms of sinus infection as well as routine follow up for hypertension and seizure disorder.   --reports nasal congestion, postnasal drip, cough, sinus pressure, ear pain, sore throat, runny nose, sneezing, chest tightness.  --onset of symptoms was 1 week ago. --hypertension -- BP controlled with current medications.  --seizure disorder -- no recent breakthrough seizures, current medications are effective in preventing seizures.     Current Medication: Outpatient Encounter Medications as of 11/12/2021  Medication Sig   albuterol (VENTOLIN HFA) 108 (90 Base) MCG/ACT inhaler Inhale 2 puffs into the lungs every 6 (six) hours as needed for wheezing or shortness of breath.   amLODipine (NORVASC) 5 MG tablet Take one tab a day for BP   amoxicillin-clavulanate (AUGMENTIN) 875-125 MG tablet Take 1 tablet by mouth 2 (two) times daily for 10 days. Take with food.   atorvastatin (LIPITOR) 10 MG tablet Take 1 tablet (10 mg total) by mouth daily.   carbamazepine (TEGRETOL) 100 MG chewable tablet TAKE 4 TABLETS IN THE MORNING AND 4 TABLETS IN THE EVENING.   cyclobenzaprine (FLEXERIL) 10 MG tablet Take 1 tablet (10 mg total) by mouth at bedtime. Take one tab po qhs for back spasm prn only   hydrochlorothiazide (HYDRODIURIL) 25 MG tablet Take 1 tablet (25 mg total) by mouth daily.   meloxicam (MOBIC) 15 MG tablet TAKE 1 TABLET DAILY   metroNIDAZOLE (FLAGYL) 500 MG tablet Take 1 tablet (500 mg total) by mouth 2 (two) times daily.   sildenafil (VIAGRA) 100 MG tablet Take 1 tablet (100 mg total) by  mouth daily as needed for erectile dysfunction.   [DISCONTINUED] chlorpheniramine-HYDROcodone (TUSSIONEX PENNKINETIC ER) 10-8 MG/5ML Take 5 mLs by mouth every 12 (twelve) hours as needed for cough.   [DISCONTINUED] traZODone (DESYREL) 100 MG tablet Take 1 tablet (100 mg total) by mouth at bedtime as needed for sleep.   chlorpheniramine-HYDROcodone (TUSSIONEX) 10-8 MG/5ML Take 5 mLs by mouth every 12 (twelve) hours as needed for cough.   No facility-administered encounter medications on file as of 11/12/2021.    Surgical History: Past Surgical History:  Procedure Laterality Date   COLONOSCOPY N/A 09/12/2020   Procedure: COLONOSCOPY;  Surgeon: Virgel Manifold, MD;  Location: [facility] ENDOSCOPY;  Service: Endoscopy;  Laterality: N/A;   FOOT SURGERY      Medical History: Past Medical History:  Diagnosis Date   Hypertension    Insomnia    Seizures ([place])    Seizures ([place])     Family History: Family History  Problem Relation Age of Onset   Hypertension Mother     Social History   Socioeconomic History   Marital status: Married    Spouse name: Not on file   Number of children: Not on file   Years of education: Not on file   Highest education level: Not on file  Occupational History   Not on file  Tobacco Use   Smoking status: Former    Types: Cigarettes    Quit date: 12/08/2017    Years since quitting: 3.9  Smokeless tobacco: Never  Vaping Use   Vaping Use: Never used  Substance and Sexual Activity   Alcohol use: Yes    Comment: ocassional   Drug use: Never   Sexual activity: Not on file  Other Topics Concern   Not on file  Social History Narrative   Not on file   Social Determinants of Health   Financial Resource Strain: Not on file  Food Insecurity: Not on file  Transportation Needs: Not on file  Physical Activity: Not on file  Stress: Not on file  Social Connections: Not on file  Intimate Partner Violence: Not on file      Review of Systems   Constitutional:  Positive for fatigue. Negative for chills and fever.  HENT:  Positive for congestion, ear pain, postnasal drip, rhinorrhea, sinus pressure, sinus pain, sneezing, sore throat and trouble swallowing.   Respiratory:  Positive for cough, chest tightness and shortness of breath. Negative for wheezing.   Cardiovascular: Negative.  Negative for chest pain and palpitations.  Gastrointestinal: Negative.   Musculoskeletal: Negative.   Skin: Negative.   Neurological:  Positive for headaches.    Vital Signs: BP 133/80   Pulse 80   Temp 98.3 F (36.8 C)   Resp 16   Ht '5\' 6"'$  (1.676 m)   Wt 221 lb (100.2 kg)   SpO2 97%   BMI 35.67 kg/m    Physical Exam Vitals reviewed.  Constitutional:      General: He is awake. He is not in acute distress.    Appearance: Normal appearance. He is well-developed and well-groomed. He is obese. He is ill-appearing.  HENT:     Head: Normocephalic and atraumatic.     Right Ear: External ear normal. No decreased hearing noted. Tenderness present. No drainage or swelling. Tympanic membrane is injected.     Left Ear: External ear normal. No decreased hearing noted. Tenderness present. No drainage or swelling. Tympanic membrane is injected.     Nose: Congestion and rhinorrhea present.     Mouth/Throat:     Mouth: Mucous membranes are moist.     Pharynx: Posterior oropharyngeal erythema present.  Eyes:     General: Lids are normal. Vision grossly intact. Gaze aligned appropriately.     Pupils: Pupils are equal, round, and reactive to light.  Cardiovascular:     Rate and Rhythm: Normal rate and regular rhythm.     Heart sounds: Normal heart sounds, S1 normal and S2 normal.  Pulmonary:     Effort: Pulmonary effort is normal. No respiratory distress.     Breath sounds: Normal breath sounds.  Musculoskeletal:     Right lower leg: No edema.     Left lower leg: No edema.  Lymphadenopathy:     Cervical: No cervical adenopathy.  Neurological:      Mental Status: He is alert and oriented to person, place, and time.  Psychiatric:        Mood and Affect: Mood normal.        Behavior: Behavior normal. Behavior is cooperative.        Assessment/Plan: 1. Acute non-recurrent sinusitis of other sinus Empiric antibiotic treatment prescribed - amoxicillin-clavulanate (AUGMENTIN) 875-125 MG tablet; Take 1 tablet by mouth 2 (two) times daily for 10 days. Take with food.  Dispense: 20 tablet; Refill: 0  2. Persistent cough for 3 weeks or longer Tussionex prescribed to control cough - chlorpheniramine-HYDROcodone (TUSSIONEX) 10-8 MG/5ML; Take 5 mLs by mouth every 12 (twelve) hours as needed for  cough.  Dispense: 200 mL; Refill: 0  3. Essential hypertension Stable, continue medications as prescribed  4. Seizure disorder ([place]) Stable and well controlled with current medication, continue medications as prescribed.    General Counseling: Casimiro Needle understanding of the findings of todays visit and agrees with plan of treatment. I have discussed any further diagnostic evaluation that may be needed or ordered today. We also reviewed his medications today. he has been encouraged to call the office with any questions or concerns that should arise related to todays visit.    No orders of the defined types were placed in this encounter.   Meds ordered this encounter  Medications   amoxicillin-clavulanate (AUGMENTIN) 875-125 MG tablet    Sig: Take 1 tablet by mouth 2 (two) times daily for 10 days. Take with food.    Dispense:  20 tablet    Refill:  0   chlorpheniramine-HYDROcodone (TUSSIONEX) 10-8 MG/5ML    Sig: Take 5 mLs by mouth every 12 (twelve) hours as needed for cough.    Dispense:  200 mL    Refill:  0    Return if symptoms worsen or fail to improve.   Total time spent:30 Minutes Time spent includes review of chart, medications, test results, and follow up plan with the patient.   [place] Controlled Substance Database was  reviewed by me.  This patient was seen by Jonetta Osgood, FNP-C in collaboration with Dr. Clayborn Bigness as a part of collaborative care agreement.   Chelsi Warr R. Valetta Fuller, MSN, FNP-C Internal medicine

## 2021-11-23 ENCOUNTER — Encounter: Payer: Self-pay | Admitting: Nurse Practitioner

## 2021-11-23 ENCOUNTER — Ambulatory Visit (INDEPENDENT_AMBULATORY_CARE_PROVIDER_SITE_OTHER): Payer: Medicare Other | Admitting: Nurse Practitioner

## 2021-11-23 VITALS — BP 121/78 | HR 78 | Temp 97.9°F | Resp 16 | Ht 66.0 in | Wt 222.4 lb

## 2021-11-23 DIAGNOSIS — R42 Dizziness and giddiness: Secondary | ICD-10-CM

## 2021-11-23 DIAGNOSIS — G40909 Epilepsy, unspecified, not intractable, without status epilepticus: Secondary | ICD-10-CM

## 2021-11-23 MED ORDER — MECLIZINE HCL 25 MG PO TABS: 25.0000 mg | ORAL_TABLET | Freq: Three times a day (TID) | ORAL | 1 refills | Status: DC | PRN

## 2021-11-23 NOTE — Progress Notes (Signed)
[facility] [place], [place] [address]  Internal MEDICINE  Office Visit Note  Patient Name: Tony Collier  [id number]  [id number]  Date of Service: 11/23/2021  Chief Complaint  Patient presents with   Acute Visit    Light headed and dizzy for 4 days     HPI Tony Collier presents for an acute sick visit for dizziness and lightheadedness x4 days.  Had a headache 4 days ago but it was typical and improved with OTC analgesic.  Denies any ear pain, ear pressure, falls, head injury. Denies any nausea or vomiting --confirms dizzy, room spinning, lightheaded, even with eyes closed, impaired balance and gait.  --has had vertigo in the past. Does not have any medication to take and has not tried any OTC medications to alleviate vertigo.  --also has recurrent sinusitis and is still experiencign residual symptoms from a sinus infection.   Additional problem -- takes carbamazepine for seizures. He has tried other medications including lamotrigine and levetiracetam (keppra). These medications were not tolerated either due to not controlling seizure adequately or significant side effects.     Current Medication:  Outpatient Encounter Medications as of 11/23/2021  Medication Sig   albuterol (VENTOLIN HFA) 108 (90 Base) MCG/ACT inhaler Inhale 2 puffs into the lungs every 6 (six) hours as needed for wheezing or shortness of breath.   amLODipine (NORVASC) 5 MG tablet Take one tab a day for BP   atorvastatin (LIPITOR) 10 MG tablet Take 1 tablet (10 mg total) by mouth daily.   carbamazepine (TEGRETOL) 100 MG chewable tablet TAKE 4 TABLETS IN THE MORNING AND 4 TABLETS IN THE EVENING.   chlorpheniramine-HYDROcodone (TUSSIONEX) 10-8 MG/5ML Take 5 mLs by mouth every 12 (twelve) hours as needed for cough.   cyclobenzaprine (FLEXERIL) 10 MG tablet Take 1 tablet (10 mg total) by mouth at bedtime. Take one tab po qhs for back spasm prn only   hydrochlorothiazide (HYDRODIURIL) 25 MG  tablet Take 1 tablet (25 mg total) by mouth daily.   meclizine (ANTIVERT) 25 MG tablet Take 1 tablet (25 mg total) by mouth 3 (three) times daily as needed for dizziness.   meloxicam (MOBIC) 15 MG tablet TAKE 1 TABLET DAILY   metroNIDAZOLE (FLAGYL) 500 MG tablet Take 1 tablet (500 mg total) by mouth 2 (two) times daily.   sildenafil (VIAGRA) 100 MG tablet Take 1 tablet (100 mg total) by mouth daily as needed for erectile dysfunction.   Tdap (BOOSTRIX) 5-2.5-18.5 LF-MCG/0.5 injection Inject 0.5 mLs into the muscle once.   No facility-administered encounter medications on file as of 11/23/2021.      Medical History: Past Medical History:  Diagnosis Date   Hypertension    Insomnia    Seizures ([facility])    Seizures ([facility])      Vital Signs: Temp 97.9 F (36.6 C)   Resp 16   Ht '5\' 6"'$  (1.676 m)   Wt 222 lb 6.4 oz (100.9 kg)   SpO2 96%   BMI 35.90 kg/m    Review of Systems  Constitutional:  Positive for fatigue. Negative for appetite change, chills and fever.  HENT:  Positive for congestion, postnasal drip and sinus pressure.   Respiratory:  Positive for shortness of breath (with minimal exertion). Negative for cough, chest tightness and wheezing.   Cardiovascular: Negative.  Negative for chest pain and palpitations.  Musculoskeletal:  Positive for gait problem. Negative for myalgias.  Neurological:  Positive for seizures and numbness.    Physical Exam Vitals  reviewed.  Constitutional:      General: He is not in acute distress.    Appearance: Normal appearance. He is obese. He is not ill-appearing.  HENT:     Head: Normocephalic and atraumatic.  Cardiovascular:     Rate and Rhythm: Normal rate and regular rhythm.  Pulmonary:     Effort: Pulmonary effort is normal. No respiratory distress.  Skin:    General: Skin is warm and dry.     Capillary Refill: Capillary refill takes less than 2 seconds.  Neurological:     Mental Status: He is alert and oriented to person, place, and  time.     Comments: Positive dix-hallpike maneuver  Psychiatric:        Mood and Affect: Mood normal.        Behavior: Behavior normal.       Assessment/Plan: 1. Vertigo Meclizine refill ordered, will follow up with neurologist that he is already established with  2. Seizure disorder ([place]) Controlled with carbamazepine. Not adequately controlled with lamotrigine or levetiracetam.    General Counseling: Tony Needle understanding of the findings of todays visit and agrees with plan of treatment. I have discussed any further diagnostic evaluation that may be needed or ordered today. We also reviewed his medications today. he has been encouraged to call the office with any questions or concerns that should arise related to todays visit.    Counseling:    No orders of the defined types were placed in this encounter.   Meds ordered this encounter  Medications   meclizine (ANTIVERT) 25 MG tablet    Sig: Take 1 tablet (25 mg total) by mouth 3 (three) times daily as needed for dizziness.    Dispense:  90 tablet    Refill:  1    Return if symptoms worsen or fail to improve, for will f/u with neurology for further evaluation, already established. .  [place] Col[place] Controlled Substance Database was reviewed by me for overdose risk score (ORS)  Time spent:20 Minutes Time spent with patient included reviewing progress notes, labs, imaging studies, and discussing plan for follow up.   This patient was seen by Tony Osgood, FNP-C in collaboration with Dr. Clayborn Collier as a part of collaborative care agreement.  Tony Collier R. Valetta Fuller, MSN, FNP-C Internal Medicine

## 2021-11-30 ENCOUNTER — Other Ambulatory Visit: Payer: Self-pay | Admitting: Nurse Practitioner

## 2021-11-30 DIAGNOSIS — I10 Essential (primary) hypertension: Secondary | ICD-10-CM

## 2021-11-30 DIAGNOSIS — E782 Mixed hyperlipidemia: Secondary | ICD-10-CM

## 2021-12-15 ENCOUNTER — Other Ambulatory Visit: Payer: Self-pay

## 2021-12-15 DIAGNOSIS — G40909 Epilepsy, unspecified, not intractable, without status epilepticus: Secondary | ICD-10-CM

## 2021-12-15 MED ORDER — CARBAMAZEPINE 100 MG PO CHEW: CHEWABLE_TABLET | ORAL | 3 refills | Status: DC

## 2021-12-29 ENCOUNTER — Encounter: Payer: Self-pay | Admitting: Nurse Practitioner

## 2021-12-29 ENCOUNTER — Telehealth: Payer: Self-pay

## 2021-12-29 NOTE — Telephone Encounter (Signed)
Faxed [phone] note to PA for tegretol

## 2022-01-13 ENCOUNTER — Encounter: Payer: Self-pay | Admitting: Nurse Practitioner

## 2022-01-14 ENCOUNTER — Other Ambulatory Visit: Payer: Self-pay | Admitting: Nurse Practitioner

## 2022-01-14 DIAGNOSIS — G40909 Epilepsy, unspecified, not intractable, without status epilepticus: Secondary | ICD-10-CM

## 2022-02-10 ENCOUNTER — Encounter: Payer: Self-pay | Admitting: Nurse Practitioner

## 2022-02-10 ENCOUNTER — Telehealth (INDEPENDENT_AMBULATORY_CARE_PROVIDER_SITE_OTHER): Payer: Medicare Other | Admitting: Nurse Practitioner

## 2022-02-10 VITALS — Resp 16 | Ht 66.0 in | Wt 220.0 lb

## 2022-02-10 DIAGNOSIS — R062 Wheezing: Secondary | ICD-10-CM

## 2022-02-10 DIAGNOSIS — J018 Other acute sinusitis: Secondary | ICD-10-CM | POA: Diagnosis not present

## 2022-02-10 DIAGNOSIS — R051 Acute cough: Secondary | ICD-10-CM

## 2022-02-10 MED ORDER — DIPHENOXYLATE-ATROPINE 2.5-0.025 MG PO TABS: 1.0000 | ORAL_TABLET | Freq: Four times a day (QID) | ORAL | 0 refills | Status: DC | PRN

## 2022-02-10 MED ORDER — HYDROCOD POLI-CHLORPHE POLI ER 10-8 MG/5ML PO SUER: 5.0000 mL | Freq: Two times a day (BID) | ORAL | 0 refills | Status: DC | PRN

## 2022-02-10 MED ORDER — LEVOFLOXACIN 750 MG PO TABS: 750.0000 mg | ORAL_TABLET | Freq: Every day | ORAL | 0 refills | Status: AC

## 2022-02-10 MED ORDER — PREDNISONE 10 MG PO TABS: ORAL_TABLET | ORAL | 0 refills | Status: DC

## 2022-02-10 NOTE — Progress Notes (Signed)
[facility] [place], [place] [address]  Internal MEDICINE  Telephone Visit  Patient Name: Tony Collier  [id number]  [id number]  Date of Service: 02/10/2022  I connected with the patient at [place] by telephone and verified the patients identity using two identifiers.   I discussed the limitations, risks, security and privacy concerns of performing an evaluation and management service by telephone and the availability of in person appointments. I also discussed with the patient that there may be a patient responsible charge related to the service.  The patient expressed understanding and agrees to proceed.    Chief Complaint  Patient presents with   Telephone Assessment    [id number]   Telephone Screen    Covid test is negative    Sinusitis   Cough    HPI Tony Collier presents for a telehealth virtual visit for symptoms of sinusitis --reports cough, runny nose, nasal congestion, SOB, wheezing, chest tightness, persistent that did not resolve from august --covid test negative    Current Medication: Outpatient Encounter Medications as of 02/10/2022  Medication Sig   albuterol (VENTOLIN HFA) 108 (90 Base) MCG/ACT inhaler Inhale 2 puffs into the lungs every 6 (six) hours as needed for wheezing or shortness of breath.   amLODipine (NORVASC) 5 MG tablet TAKE 1 TABLET DAILY FOR BLOOD PRESSURE   atorvastatin (LIPITOR) 10 MG tablet TAKE 1 TABLET DAILY   carbamazepine (TEGRETOL) 100 MG chewable tablet CHEW AND SWALLOW 4 TABLETS BY MOUTH IN THE MORNING AND IN THE EVENING   chlorpheniramine-HYDROcodone (TUSSIONEX) 10-8 MG/5ML Take 5 mLs by mouth every 12 (twelve) hours as needed for cough.   cyclobenzaprine (FLEXERIL) 10 MG tablet Take 1 tablet (10 mg total) by mouth at bedtime. Take one tab po qhs for back spasm prn only   diphenoxylate-atropine (LOMOTIL) 2.5-0.025 MG tablet Take 1 tablet by mouth 4 (four) times daily as needed for diarrhea or loose stools.    hydrochlorothiazide (HYDRODIURIL) 25 MG tablet Take 1 tablet (25 mg total) by mouth daily.   levofloxacin (LEVAQUIN) 750 MG tablet Take 1 tablet (750 mg total) by mouth daily for 7 days.   meclizine (ANTIVERT) 25 MG tablet Take 1 tablet (25 mg total) by mouth 3 (three) times daily as needed for dizziness.   meloxicam (MOBIC) 15 MG tablet TAKE 1 TABLET DAILY   metroNIDAZOLE (FLAGYL) 500 MG tablet Take 1 tablet (500 mg total) by mouth 2 (two) times daily.   predniSONE (DELTASONE) 10 MG tablet Take one tab 3 x day for 3 days, then take one tab 2 x a day for 3 days and then take one tab a day for 3 days for URI   sildenafil (VIAGRA) 100 MG tablet Take 1 tablet (100 mg total) by mouth daily as needed for erectile dysfunction.   Tdap (BOOSTRIX) 5-2.5-18.5 LF-MCG/0.5 injection Inject 0.5 mLs into the muscle once.   [DISCONTINUED] chlorpheniramine-HYDROcodone (TUSSIONEX) 10-8 MG/5ML Take 5 mLs by mouth every 12 (twelve) hours as needed for cough. (Patient not taking: Reported on 02/10/2022)   No facility-administered encounter medications on file as of 02/10/2022.    Surgical History: Past Surgical History:  Procedure Laterality Date   COLONOSCOPY N/A 09/12/2020   Procedure: COLONOSCOPY;  Surgeon: Virgel Manifold, MD;  Location: [facility] ENDOSCOPY;  Service: Endoscopy;  Laterality: N/A;   FOOT SURGERY      Medical History: Past Medical History:  Diagnosis Date   Hypertension    Insomnia    Seizures ([place])  Seizures ([place])     Family History: Family History  Problem Relation Age of Onset   Hypertension Mother     Social History   Socioeconomic History   Marital status: Married    Spouse name: Not on file   Number of children: Not on file   Years of education: Not on file   Highest education level: Not on file  Occupational History   Not on file  Tobacco Use   Smoking status: Former    Types: Cigarettes    Quit date: 12/08/2017    Years since quitting: 4.1   Smokeless tobacco:  Never  Vaping Use   Vaping Use: Never used  Substance and Sexual Activity   Alcohol use: Yes    Comment: ocassional   Drug use: Never   Sexual activity: Not on file  Other Topics Concern   Not on file  Social History Narrative   Not on file   Social Determinants of Health   Financial Resource Strain: Not on file  Food Insecurity: Not on file  Transportation Needs: Not on file  Physical Activity: Not on file  Stress: Not on file  Social Connections: Not on file  Intimate Partner Violence: Not on file      Review of Systems  Constitutional:  Positive for appetite change, chills and fatigue.  HENT:  Positive for congestion.   Gastrointestinal:  Positive for diarrhea and nausea.    Vital Signs: Resp 16   Ht '5\' 6"'$  (1.676 m)   Wt 220 lb (99.8 kg)   BMI 35.51 kg/m    Observation/Objective: He is alert and oriented and engages in conversation appropriately. He does not seem to be in any acute distress over video call.    Assessment/Plan: 1. Acute non-recurrent sinusitis of other sinus Empiric antibiotic treatment prescribed, had augmentin in August so switch to different medication this time - levofloxacin (LEVAQUIN) 750 MG tablet; Take 1 tablet (750 mg total) by mouth daily for 7 days.  Dispense: 7 tablet; Refill: 0  2. Acute cough Medication prescribed for symptomatic relief of cough - chlorpheniramine-HYDROcodone (TUSSIONEX) 10-8 MG/5ML; Take 5 mLs by mouth every 12 (twelve) hours as needed for cough.  Dispense: 140 mL; Refill: 0  3. Wheezing Prednisone taper prescribed to decrease inflammation and wheezing. - predniSONE (DELTASONE) 10 MG tablet; Take one tab 3 x day for 3 days, then take one tab 2 x a day for 3 days and then take one tab a day for 3 days for URI  Dispense: 18 tablet; Refill: 0   General Counseling: [name] verbalizes understanding of the findings of today's phone visit and agrees with plan of treatment. I have discussed any further diagnostic  evaluation that may be needed or ordered today. We also reviewed his medications today. he has been encouraged to call the office with any questions or concerns that should arise related to todays visit.  No follow-ups on file.   No orders of the defined types were placed in this encounter.   Meds ordered this encounter  Medications   levofloxacin (LEVAQUIN) 750 MG tablet    Sig: Take 1 tablet (750 mg total) by mouth daily for 7 days.    Dispense:  7 tablet    Refill:  0    Please fill today   predniSONE (DELTASONE) 10 MG tablet    Sig: Take one tab 3 x day for 3 days, then take one tab 2 x a day for 3 days and then take  one tab a day for 3 days for URI    Dispense:  18 tablet    Refill:  0   chlorpheniramine-HYDROcodone (TUSSIONEX) 10-8 MG/5ML    Sig: Take 5 mLs by mouth every 12 (twelve) hours as needed for cough.    Dispense:  140 mL    Refill:  0   diphenoxylate-atropine (LOMOTIL) 2.5-0.025 MG tablet    Sig: Take 1 tablet by mouth 4 (four) times daily as needed for diarrhea or loose stools.    Dispense:  30 tablet    Refill:  0    Time spent:10 Minutes Time spent with patient included reviewing progress notes, labs, imaging studies, and discussing plan for follow up.  [place] Controlled Substance Database was reviewed by me for overdose risk score (ORS) if appropriate.  This patient was seen by Jonetta Osgood, FNP-C in collaboration with Dr. Clayborn Bigness as a part of collaborative care agreement.  Keonda Dow R. Valetta Fuller, MSN, FNP-C Internal medicine

## 2022-05-06 ENCOUNTER — Ambulatory Visit (INDEPENDENT_AMBULATORY_CARE_PROVIDER_SITE_OTHER): Payer: Medicare Other | Admitting: Nurse Practitioner

## 2022-05-06 ENCOUNTER — Encounter: Payer: Self-pay | Admitting: Nurse Practitioner

## 2022-05-06 ENCOUNTER — Other Ambulatory Visit: Payer: Self-pay | Admitting: Student

## 2022-05-06 VITALS — BP 150/84 | HR 66 | Temp 96.4°F | Resp 16 | Ht 66.0 in | Wt 222.6 lb

## 2022-05-06 DIAGNOSIS — H02401 Unspecified ptosis of right eyelid: Secondary | ICD-10-CM

## 2022-05-06 DIAGNOSIS — R9431 Abnormal electrocardiogram [ECG] [EKG]: Secondary | ICD-10-CM

## 2022-05-06 DIAGNOSIS — R0789 Other chest pain: Secondary | ICD-10-CM | POA: Diagnosis not present

## 2022-05-06 NOTE — Progress Notes (Addendum)
[facility] [place], [place] [address]  Internal MEDICINE  Office Visit Note  Patient Name: Tony Collier  [id number]  [id number]  Date of Service: 05/06/2022  Chief Complaint  Patient presents with   Acute Visit    Chest pain x 1 month      HPI Tony Collier presents for an acute sick visit for chest pain.  Chest pain x1 month, sweats.  Episode lasts for 10 minutes, once daily or every other day.  Denies any radiation of pain, nausea, SOB, numbness or epigastric pain.  BP elevated, improved when rechecked Has history of seizures, being evaluated by neurology for  myasthenia gravis Abnormal EKG with some leads with ST elevated. Need to do work up and refer to cards.     Current Medication:  Outpatient Encounter Medications as of 05/06/2022  Medication Sig   albuterol (VENTOLIN HFA) 108 (90 Base) MCG/ACT inhaler Inhale 2 puffs into the lungs every 6 (six) hours as needed for wheezing or shortness of breath.   amLODipine (NORVASC) 5 MG tablet TAKE 1 TABLET DAILY FOR BLOOD PRESSURE   atorvastatin (LIPITOR) 10 MG tablet TAKE 1 TABLET DAILY   carbamazepine (TEGRETOL) 100 MG chewable tablet CHEW AND SWALLOW 4 TABLETS BY MOUTH IN THE MORNING AND IN THE EVENING   chlorpheniramine-HYDROcodone (TUSSIONEX) 10-8 MG/5ML Take 5 mLs by mouth every 12 (twelve) hours as needed for cough.   cyclobenzaprine (FLEXERIL) 10 MG tablet Take 1 tablet (10 mg total) by mouth at bedtime. Take one tab po qhs for back spasm prn only   diphenoxylate-atropine (LOMOTIL) 2.5-0.025 MG tablet Take 1 tablet by mouth 4 (four) times daily as needed for diarrhea or loose stools.   hydrochlorothiazide (HYDRODIURIL) 25 MG tablet Take 1 tablet (25 mg total) by mouth daily.   meclizine (ANTIVERT) 25 MG tablet Take 1 tablet (25 mg total) by mouth 3 (three) times daily as needed for dizziness.   meloxicam (MOBIC) 15 MG tablet TAKE 1 TABLET DAILY   metroNIDAZOLE (FLAGYL) 500 MG tablet Take 1 tablet  (500 mg total) by mouth 2 (two) times daily.   predniSONE (DELTASONE) 10 MG tablet Take one tab 3 x day for 3 days, then take one tab 2 x a day for 3 days and then take one tab a day for 3 days for URI   sildenafil (VIAGRA) 100 MG tablet Take 1 tablet (100 mg total) by mouth daily as needed for erectile dysfunction.   Tdap (BOOSTRIX) 5-2.5-18.5 LF-MCG/0.5 injection Inject 0.5 mLs into the muscle once.   No facility-administered encounter medications on file as of 05/06/2022.      Medical History: Past Medical History:  Diagnosis Date   Hypertension    Insomnia    Seizures (H[facility])    Seizures ([facility])      Vital Signs: BP (!) 150/84 Comment: 177/91  Pulse 66   Temp (!) 96.4 F (35.8 C)   Resp 16   Ht '5\' 6"'$  (1.676 m)   Wt 222 lb 9.6 oz (101 kg)   SpO2 98%   BMI 35.93 kg/m    Review of Systems  Constitutional: Negative.  Negative for fatigue.  HENT: Negative.    Eyes: Negative.   Respiratory: Negative.  Negative for cough, chest tightness, shortness of breath and wheezing.   Cardiovascular:  Positive for chest pain. Negative for palpitations.  Gastrointestinal:  Negative for abdominal pain, constipation, diarrhea, nausea and vomiting.  Genitourinary: Negative.   Musculoskeletal: Negative.     Physical  Exam Vitals reviewed.  Constitutional:      General: He is not in acute distress.    Appearance: Normal appearance. He is obese. He is not ill-appearing.  HENT:     Head: Normocephalic and atraumatic.  Eyes:     Pupils: Pupils are equal, round, and reactive to light.  Cardiovascular:     Rate and Rhythm: Normal rate and regular rhythm.     Heart sounds: Normal heart sounds. No murmur heard. Pulmonary:     Effort: Pulmonary effort is normal. No respiratory distress.     Breath sounds: Normal breath sounds. No wheezing.  Neurological:     Mental Status: He is alert and oriented to person, place, and time.  Psychiatric:        Mood and Affect: Mood normal.         Behavior: Behavior normal.       Assessment/Plan: 1. Atypical chest pain EKG done -- result is abnormal and inconsistent. Echocardiogram and stress test ordered.  Referred to cardiology to be scheduled after additional testing.  - EKG 12-Lead - ECHOCARDIOGRAM COMPLETE; Future - Myocardial Perfusion Imaging; Future - Ambulatory referral to Cardiology  2. Abnormal EKG Echocardiogram and stress test ordered.  Referrred to cardiology for further evaluation after additional testing is done.  - ECHOCARDIOGRAM COMPLETE; Future - Myocardial Perfusion Imaging; Future - Ambulatory referral to Cardiology   General Counseling: Tony Needle understanding of the findings of todays visit and agrees with plan of treatment. I have discussed any further diagnostic evaluation that may be needed or ordered today. We also reviewed his medications today. he has been encouraged to call the office with any questions or concerns that should arise related to todays visit.    Counseling:    Orders Placed This Encounter  Procedures   NM Myocar Multi W/Spect W/Wall Motion / EF   Ambulatory referral to Cardiology   EKG 12-Lead   ECHOCARDIOGRAM COMPLETE    No orders of the defined types were placed in this encounter.   Return if symptoms worsen or fail to improve, for referred to cardiology and having additional testing. may need f/u to discussecho and stress test.  [place] Controlled Substance Database was reviewed by me for overdose risk score (ORS)  Time spent:20 Minutes Time spent with patient included reviewing progress notes, labs, imaging studies, and discussing plan for follow up.   This patient was seen by Jonetta Osgood, FNP-C in collaboration with Dr. Clayborn Bigness as a part of collaborative care agreement.  Adana Marik R. Valetta Fuller, MSN, FNP-C Internal Medicine

## 2022-05-06 NOTE — Addendum Note (Signed)
Addended by: Jonetta Osgood on: 05/06/2022 03:57 PM   Modules accepted: Orders

## 2022-05-07 ENCOUNTER — Telehealth: Payer: Self-pay | Admitting: Nurse Practitioner

## 2022-05-07 NOTE — Telephone Encounter (Signed)
Notified patient of myocardio perfusion and echo appointment dates, times, locations and preps-Toni

## 2022-05-11 ENCOUNTER — Other Ambulatory Visit: Payer: Self-pay | Admitting: Nurse Practitioner

## 2022-05-11 DIAGNOSIS — G40909 Epilepsy, unspecified, not intractable, without status epilepticus: Secondary | ICD-10-CM

## 2022-05-11 MED ORDER — CARBAMAZEPINE 100 MG PO CHEW: CHEWABLE_TABLET | ORAL | 0 refills | Status: DC

## 2022-05-13 ENCOUNTER — Encounter (HOSPITAL_BASED_OUTPATIENT_CLINIC_OR_DEPARTMENT_OTHER)
Admission: RE | Admit: 2022-05-13 | Discharge: 2022-05-13 | Disposition: A | Discharge status: Home / Self Care | Payer: Medicare Other | Source: Ambulatory Visit | Attending: Nurse Practitioner | Admitting: Nurse Practitioner

## 2022-05-13 ENCOUNTER — Ambulatory Visit
Admission: RE | Admit: 2022-05-13 | Discharge: 2022-05-13 | Disposition: A | Discharge status: Home / Self Care | Payer: Medicare Other | Source: Ambulatory Visit | Attending: Student | Admitting: Student

## 2022-05-13 DIAGNOSIS — R9431 Abnormal electrocardiogram [ECG] [EKG]: Secondary | ICD-10-CM | POA: Insufficient documentation

## 2022-05-13 DIAGNOSIS — R0789 Other chest pain: Secondary | ICD-10-CM | POA: Insufficient documentation

## 2022-05-13 DIAGNOSIS — H02401 Unspecified ptosis of right eyelid: Secondary | ICD-10-CM | POA: Diagnosis present

## 2022-05-13 LAB — NM MYOCAR MULTI W/SPECT W/WALL MOTION / EF
Base ST Depression (mm): 0 mm
LV dias vol: 55 mL (ref 62–150)
LV sys vol: 17 mL
Nuc Stress EF: 69 %
Peak HR: 101 {beats}/min
Percent HR: 58 %
Rest HR: 67 {beats}/min
Rest Nuclear Isotope Dose: 10.2 mCi
SDS: 0
SRS: 0
SSS: 0
ST Depression (mm): 0 mm
Stress Nuclear Isotope Dose: 30.7 mCi
TID: 0.94

## 2022-05-13 MED ORDER — GADOBUTROL 1 MMOL/ML IV SOLN
10.0000 mL | Freq: Once | INTRAVENOUS | Status: AC | PRN
  Administered 2022-05-13: 10 mL via INTRAVENOUS

## 2022-05-13 MED ORDER — REGADENOSON 0.4 MG/5ML IV SOLN
0.4000 mg | Freq: Once | INTRAVENOUS | Status: AC
  Administered 2022-05-13: 0.4 mg via INTRAVENOUS

## 2022-05-13 MED ORDER — TECHNETIUM TC 99M TETROFOSMIN IV KIT
10.1800 | PACK | Freq: Once | INTRAVENOUS | Status: AC | PRN
  Administered 2022-05-13: 10.18 via INTRAVENOUS

## 2022-05-13 MED ORDER — TECHNETIUM TC 99M TETROFOSMIN IV KIT
30.0000 | PACK | Freq: Once | INTRAVENOUS | Status: AC
  Administered 2022-05-13: 30.65 via INTRAVENOUS

## 2022-05-28 ENCOUNTER — Other Ambulatory Visit: Payer: Self-pay | Admitting: Nurse Practitioner

## 2022-05-28 DIAGNOSIS — E782 Mixed hyperlipidemia: Secondary | ICD-10-CM

## 2022-05-28 DIAGNOSIS — I10 Essential (primary) hypertension: Secondary | ICD-10-CM

## 2022-06-03 ENCOUNTER — Ambulatory Visit
Admission: RE | Admit: 2022-06-03 | Discharge: 2022-06-03 | Disposition: A | Discharge status: Home / Self Care | Payer: Medicare Other | Source: Ambulatory Visit | Attending: Nurse Practitioner | Admitting: Nurse Practitioner

## 2022-06-03 DIAGNOSIS — I081 Rheumatic disorders of both mitral and tricuspid valves: Secondary | ICD-10-CM | POA: Insufficient documentation

## 2022-06-03 DIAGNOSIS — I10 Essential (primary) hypertension: Secondary | ICD-10-CM | POA: Insufficient documentation

## 2022-06-03 DIAGNOSIS — R0789 Other chest pain: Secondary | ICD-10-CM

## 2022-06-03 DIAGNOSIS — R9431 Abnormal electrocardiogram [ECG] [EKG]: Secondary | ICD-10-CM | POA: Diagnosis present

## 2022-06-03 LAB — ECHOCARDIOGRAM COMPLETE
AR max vel: 3.01 cm2
AV Area VTI: 2.64 cm2
AV Area mean vel: 2.96 cm2
AV Mean grad: 3 mmHg
AV Peak grad: 5.6 mmHg
Ao pk vel: 1.18 m/s
Area-P 1/2: 2.14 cm2
MV VTI: 2.98 cm2
S' Lateral: 3.1 cm

## 2022-06-03 NOTE — Progress Notes (Signed)
*  PRELIMINARY RESULTS* Echocardiogram 2D Echocardiogram has been performed.  Tony Collier 06/03/2022, 10:57 AM

## 2022-06-06 NOTE — Progress Notes (Unsigned)
No chief complaint on file.  History of Present Illness: 47 yo male with history of HTN, seizure disorder and vertigo here today as a new consult, referred by ***, for the evaluation of chest pain. Nuclear stress test 05/13/22 with no ischemia. Echo 06/03/22 with LVEF=55-60%. Mild MR.   Primary Care Physician: Jonetta Osgood, NP   Past Medical History:  Diagnosis Date   Chest pain    Dizziness    Hypertension    Insomnia    Ptosis, right eyelid    Seizures ([place])    Seizures ([place])    Vertigo     Past Surgical History:  Procedure Laterality Date   COLONOSCOPY N/A 09/12/2020   Procedure: COLONOSCOPY;  Surgeon: Virgel Manifold, MD;  Location: [facility] ENDOSCOPY;  Service: Endoscopy;  Laterality: N/A;   FOOT SURGERY      Current Outpatient Medications  Medication Sig Dispense Refill   albuterol (VENTOLIN HFA) 108 (90 Base) MCG/ACT inhaler Inhale 2 puffs into the lungs every 6 (six) hours as needed for wheezing or shortness of breath. 8 g 0   amLODipine (NORVASC) 5 MG tablet TAKE 1 TABLET DAILY FOR BLOOD PRESSURE 90 tablet 3   atorvastatin (LIPITOR) 10 MG tablet TAKE 1 TABLET DAILY 90 tablet 3   carbamazepine (TEGRETOL) 100 MG chewable tablet CHEW 4 TABLETS IN THE MORNING AND 4 TABLETS IN THE EVENING 240 tablet 0   chlorpheniramine-HYDROcodone (TUSSIONEX) 10-8 MG/5ML Take 5 mLs by mouth every 12 (twelve) hours as needed for cough. 140 mL 0   cyclobenzaprine (FLEXERIL) 10 MG tablet Take 1 tablet (10 mg total) by mouth at bedtime. Take one tab po qhs for back spasm prn only 30 tablet 1   diphenoxylate-atropine (LOMOTIL) 2.5-0.025 MG tablet Take 1 tablet by mouth 4 (four) times daily as needed for diarrhea or loose stools. 30 tablet 0   hydrochlorothiazide (HYDRODIURIL) 25 MG tablet TAKE 1 TABLET DAILY 90 tablet 3   meclizine (ANTIVERT) 25 MG tablet Take 1 tablet (25 mg total) by mouth 3 (three) times daily as needed for dizziness. 90 tablet 1   meloxicam (MOBIC) 15 MG tablet TAKE 1  TABLET DAILY 90 tablet 3   metroNIDAZOLE (FLAGYL) 500 MG tablet Take 1 tablet (500 mg total) by mouth 2 (two) times daily. 14 tablet 0   predniSONE (DELTASONE) 10 MG tablet Take one tab 3 x day for 3 days, then take one tab 2 x a day for 3 days and then take one tab a day for 3 days for URI 18 tablet 0   sildenafil (VIAGRA) 100 MG tablet Take 1 tablet (100 mg total) by mouth daily as needed for erectile dysfunction. 30 tablet 2   Tdap (BOOSTRIX) 5-2.5-18.5 LF-MCG/0.5 injection Inject 0.5 mLs into the muscle once.     No current facility-administered medications for this visit.    Allergies  Allergen Reactions   Tylenol [Acetaminophen]     Pt gets pink eye    Social History   Socioeconomic History   Marital status: Married    Spouse name: Not on file   Number of children: Not on file   Years of education: Not on file   Highest education level: Not on file  Occupational History   Not on file  Tobacco Use   Smoking status: Former    Types: Cigarettes    Quit date: 12/08/2017    Years since quitting: 4.4   Smokeless tobacco: Never  Vaping Use   Vaping Use: Never used  Substance and Sexual Activity   Alcohol use: Not Currently    Comment: ocassional   Drug use: Never   Sexual activity: Not on file  Other Topics Concern   Not on file  Social History Narrative   Not on file   Social Determinants of Health   Financial Resource Strain: Not on file  Food Insecurity: Not on file  Transportation Needs: Not on file  Physical Activity: Not on file  Stress: Not on file  Social Connections: Not on file  Intimate Partner Violence: Not on file    Family History  Problem Relation Age of Onset   Hypertension Mother     Review of Systems:  As stated in the HPI and otherwise negative.   There were no vitals taken for this visit.  Physical Examination: General: Well developed, well nourished, NAD  HEENT: OP clear, mucus membranes moist  SKIN: warm, dry. No rashes. Neuro: No  focal deficits  Musculoskeletal: Muscle strength 5/5 all ext  Psychiatric: Mood and affect normal  Neck: No JVD, no carotid bruits, no thyromegaly, no lymphadenopathy.  Lungs:Clear bilaterally, no wheezes, rhonci, crackles Cardiovascular: Regular rate and rhythm. No murmurs, gallops or rubs. Abdomen:Soft. Bowel sounds present. Non-tender.  Extremities: No lower extremity edema. Pulses are 2 + in the bilateral DP/PT.  EKG:  EKG {ACTION; IS/IS GI:[id number] ordered today. The ekg ordered today demonstrates ***  Echo 06/03/22:  1. Left ventricular ejection fraction, by estimation, is 55 to 60%. The  left ventricle has normal function. The left ventricle has no regional  wall motion abnormalities. Left ventricular diastolic parameters are  consistent with Grade I diastolic  dysfunction (impaired relaxation).   2. Right ventricular systolic function is normal. The right ventricular  size is normal. Tricuspid regurgitation signal is inadequate for assessing  PA pressure.   3. The mitral valve is normal in structure. Mild mitral valve  regurgitation. No evidence of mitral stenosis.   4. The aortic valve was not well visualized. Aortic valve regurgitation  is not visualized. No aortic stenosis is present.   5. The inferior vena cava is normal in size with greater than 50%  respiratory variability, suggesting right atrial pressure of 3 mmHg.   Recent Labs: 08/13/2021: ALT 28; BUN 10; Creatinine, Ser 1.01; Hemoglobin 14.7; Platelets 205; Potassium 3.6; Sodium 141; TSH 0.362   Lipid Panel    Component Value Date/Time   CHOL 133 08/13/2021 1234   CHOL 191 08/18/2020 1556   TRIG 100 08/13/2021 1234   HDL 34 (L) 08/13/2021 1234   HDL 35 (L) 08/18/2020 1556   CHOLHDL 3.9 08/13/2021 1234   VLDL 20 08/13/2021 1234   LDLCALC 79 08/13/2021 1234   LDLCALC 120 (H) 08/18/2020 1556     Wt Readings from Last 3 Encounters:  05/06/22 101 kg  02/10/22 99.8 kg  11/23/21 100.9 kg      Assessment  and Plan:   1.   Labs/ tests ordered today include:  No orders of the defined types were placed in this encounter.    Disposition:   F/U with me in ***    Signed, Lauree Chandler, MD, [facility] 06/06/2022 8:25 PM    [facility] [place], [place], [place]  [address] Phone: [phone]; Fax: [phone]

## 2022-06-07 ENCOUNTER — Encounter: Payer: Self-pay | Admitting: Cardiovascular Disease

## 2022-06-07 ENCOUNTER — Ambulatory Visit: Payer: Medicare Other | Attending: Cardiovascular Disease | Admitting: Cardiovascular Disease

## 2022-06-07 VITALS — BP 120/90 | HR 74 | Ht 66.0 in | Wt 219.0 lb

## 2022-06-07 DIAGNOSIS — R072 Precordial pain: Secondary | ICD-10-CM | POA: Diagnosis present

## 2022-06-07 DIAGNOSIS — R079 Chest pain, unspecified: Secondary | ICD-10-CM | POA: Insufficient documentation

## 2022-06-07 MED ORDER — METOPROLOL TARTRATE 100 MG PO TABS: 100.0000 mg | ORAL_TABLET | Freq: Once | ORAL | 0 refills | Status: DC

## 2022-06-07 NOTE — Patient Instructions (Addendum)
Medication Instructions:  No changes *If you need a refill on your cardiac medications before your next appointment, please call your pharmacy*   Lab Work: Today: bmet   Testing/Procedures: Coronary CTA - see instructions below   Follow-Up: Depending on test results.    Your cardiac CT will be scheduled at  [facility] [place], [place] [address] [phone]   Please arrive at the [facility] Entrance (Entrance C2) of [facility] 30 minutes prior to test start time. You can use the FREE valet parking offered at entrance C (encouraged to control the heart rate for the test)  Proceed to the [facility] Radiology Department (first floor) to check-in and test prep.  All radiology patients and guests should use entrance C2 at [facility], accessed from [facility], even though the hospital's physical address listed is [address]     Please follow these instructions carefully (unless otherwise directed):  Hold all erectile dysfunction medications at least 3 days (72 hrs) prior to test. (Ie viagra, cialis, sildenafil, tadalafil, etc) We will administer nitroglycerin during this exam.   On the Night Before the Test: Be sure to Drink plenty of water. Do not consume any caffeinated/decaffeinated beverages or chocolate 12 hours prior to your test. Do not take any antihistamines 12 hours prior to your test.  On the Day of the Test: Drink plenty of water until 1 hour prior to the test. Do not eat any food 1 hour prior to test. You may take your regular medications prior to the test.  Take metoprolol (Lopressor) two hours prior to test. Hydrochlorothiazide-- please HOLD on the morning of the test.       After the Test: Drink plenty of water. After receiving IV contrast, you may experience a mild flushed feeling. This is normal. On occasion, you may experience a mild rash up to 24 hours after the  test. This is not dangerous. If this occurs, you can take Benadryl 25 mg and increase your fluid intake. If you experience trouble breathing, this can be serious. If it is severe call 911 IMMEDIATELY. If it is mild, please call our office. If you take any of these medications: Glipizide/Metformin, Avandament, Glucavance, please do not take 48 hours after completing test unless otherwise instructed.  We will call to schedule your test 2-4 weeks out understanding that some insurance companies will need an authorization prior to the service being performed.   For non-scheduling related questions, please contact the cardiac imaging nurse navigator should you have any questions/concerns: Marchia Bond, Cardiac Imaging Nurse Navigator Gordy Clement, Cardiac Imaging Nurse Navigator [facility] Direct Office Dial: [phone]   For scheduling needs, including cancellations and rescheduling, please call [place], [phone].

## 2022-06-08 ENCOUNTER — Other Ambulatory Visit: Payer: Self-pay | Admitting: Cardiovascular Disease

## 2022-06-08 LAB — BASIC METABOLIC PANEL
BUN/Creatinine Ratio: 14 (ref 9–20)
BUN: 14 mg/dL (ref 6–24)
CO2: 23 mmol/L (ref 20–29)
Calcium: 9.9 mg/dL (ref 8.7–10.2)
Chloride: 104 mmol/L (ref 96–106)
Creatinine, Ser: 1 mg/dL (ref 0.76–1.27)
Glucose: 123 mg/dL — ABNORMAL HIGH (ref 70–99)
Potassium: 4.2 mmol/L (ref 3.5–5.2)
Sodium: 141 mmol/L (ref 134–144)
eGFR: 93 mL/min/{1.73_m2} (ref >59)

## 2022-07-30 ENCOUNTER — Other Ambulatory Visit: Payer: Self-pay

## 2022-07-30 ENCOUNTER — Other Ambulatory Visit: Payer: Self-pay | Admitting: Nurse Practitioner

## 2022-07-30 DIAGNOSIS — G40909 Epilepsy, unspecified, not intractable, without status epilepticus: Secondary | ICD-10-CM

## 2022-07-30 MED ORDER — CARBAMAZEPINE 100 MG PO CHEW
CHEWABLE_TABLET | ORAL | 0 refills | Status: DC
Start: 2022-07-30 — End: 2022-08-02

## 2022-08-03 ENCOUNTER — Telehealth (HOSPITAL_COMMUNITY): Payer: Self-pay | Admitting: *Deleted

## 2022-08-03 NOTE — Telephone Encounter (Signed)
Attempted to call patient regarding upcoming cardiac CT appointment. °Left message on voicemail with name and callback number ° °Thedora Rings RN Navigator Cardiac Imaging °[facility] °[phone] Office °[phone] Cell ° °

## 2022-08-04 ENCOUNTER — Other Ambulatory Visit (HOSPITAL_COMMUNITY): Payer: Self-pay | Admitting: *Deleted

## 2022-08-04 ENCOUNTER — Telehealth (HOSPITAL_COMMUNITY): Payer: Self-pay | Admitting: *Deleted

## 2022-08-04 MED ORDER — METOPROLOL TARTRATE 100 MG PO TABS: 100.0000 mg | ORAL_TABLET | Freq: Once | ORAL | 0 refills | Status: DC

## 2022-08-04 NOTE — Telephone Encounter (Signed)
Reaching out to patient to offer assistance regarding upcoming cardiac imaging study; pt's wife answered phone and verbalizes understanding of appt date/time, parking situation and where to check in, pre-test NPO status and medications ordered, and verified current allergies; name and call back number provided for further questions should they arise  Tyrek Lawhorn RN Navigator Cardiac Imaging [facility] [phone] office [phone] cell  Patient to take 100mg metoprolol tartrate two hours prior to his cardiac CT scan.  

## 2022-08-05 ENCOUNTER — Other Ambulatory Visit (HOSPITAL_COMMUNITY): Payer: Self-pay | Admitting: Cardiovascular Disease

## 2022-08-05 ENCOUNTER — Ambulatory Visit
Admission: RE | Admit: 2022-08-05 | Discharge: 2022-08-05 | Disposition: A | Discharge status: Home / Self Care | Payer: Medicare Other | Source: Ambulatory Visit | Attending: Cardiovascular Disease | Admitting: Cardiovascular Disease

## 2022-08-05 DIAGNOSIS — R072 Precordial pain: Secondary | ICD-10-CM | POA: Insufficient documentation

## 2022-08-05 DIAGNOSIS — R079 Chest pain, unspecified: Secondary | ICD-10-CM | POA: Insufficient documentation

## 2022-08-05 MED ORDER — IOHEXOL 350 MG/ML SOLN
100.0000 mL | Freq: Once | INTRAVENOUS | Status: AC | PRN
  Administered 2022-08-05: 100 mL via INTRAVENOUS

## 2022-08-05 MED ORDER — NITROGLYCERIN 0.4 MG SL SUBL
0.8000 mg | SUBLINGUAL_TABLET | Freq: Once | SUBLINGUAL | Status: AC
  Administered 2022-08-05: 0.8 mg via SUBLINGUAL

## 2022-08-05 NOTE — Progress Notes (Signed)
Patient tolerated procedure well. Ambulate w/o difficulty. Denies light headedness or being dizzy. Sitting in chair drinking water provided. Encouraged to drink extra water today and reasoning explained. Verbalized understanding. All questions answered. ABC intact. No further needs. Discharge from procedure area w/o issues.   °

## 2022-08-15 IMAGING — CR DG CHEST 2V
1 series · 2 of 2 positions shown · non-contrast
Comparison: None.

CLINICAL DATA: Cough, congestion, sore throat, and difficulty
taking deep breaths.

EXAM:
CHEST - 2 VIEW

[Series 1: dg chest 2 view · 0.14mm/px · 2 of 2 slices shown]
[im 1/2]
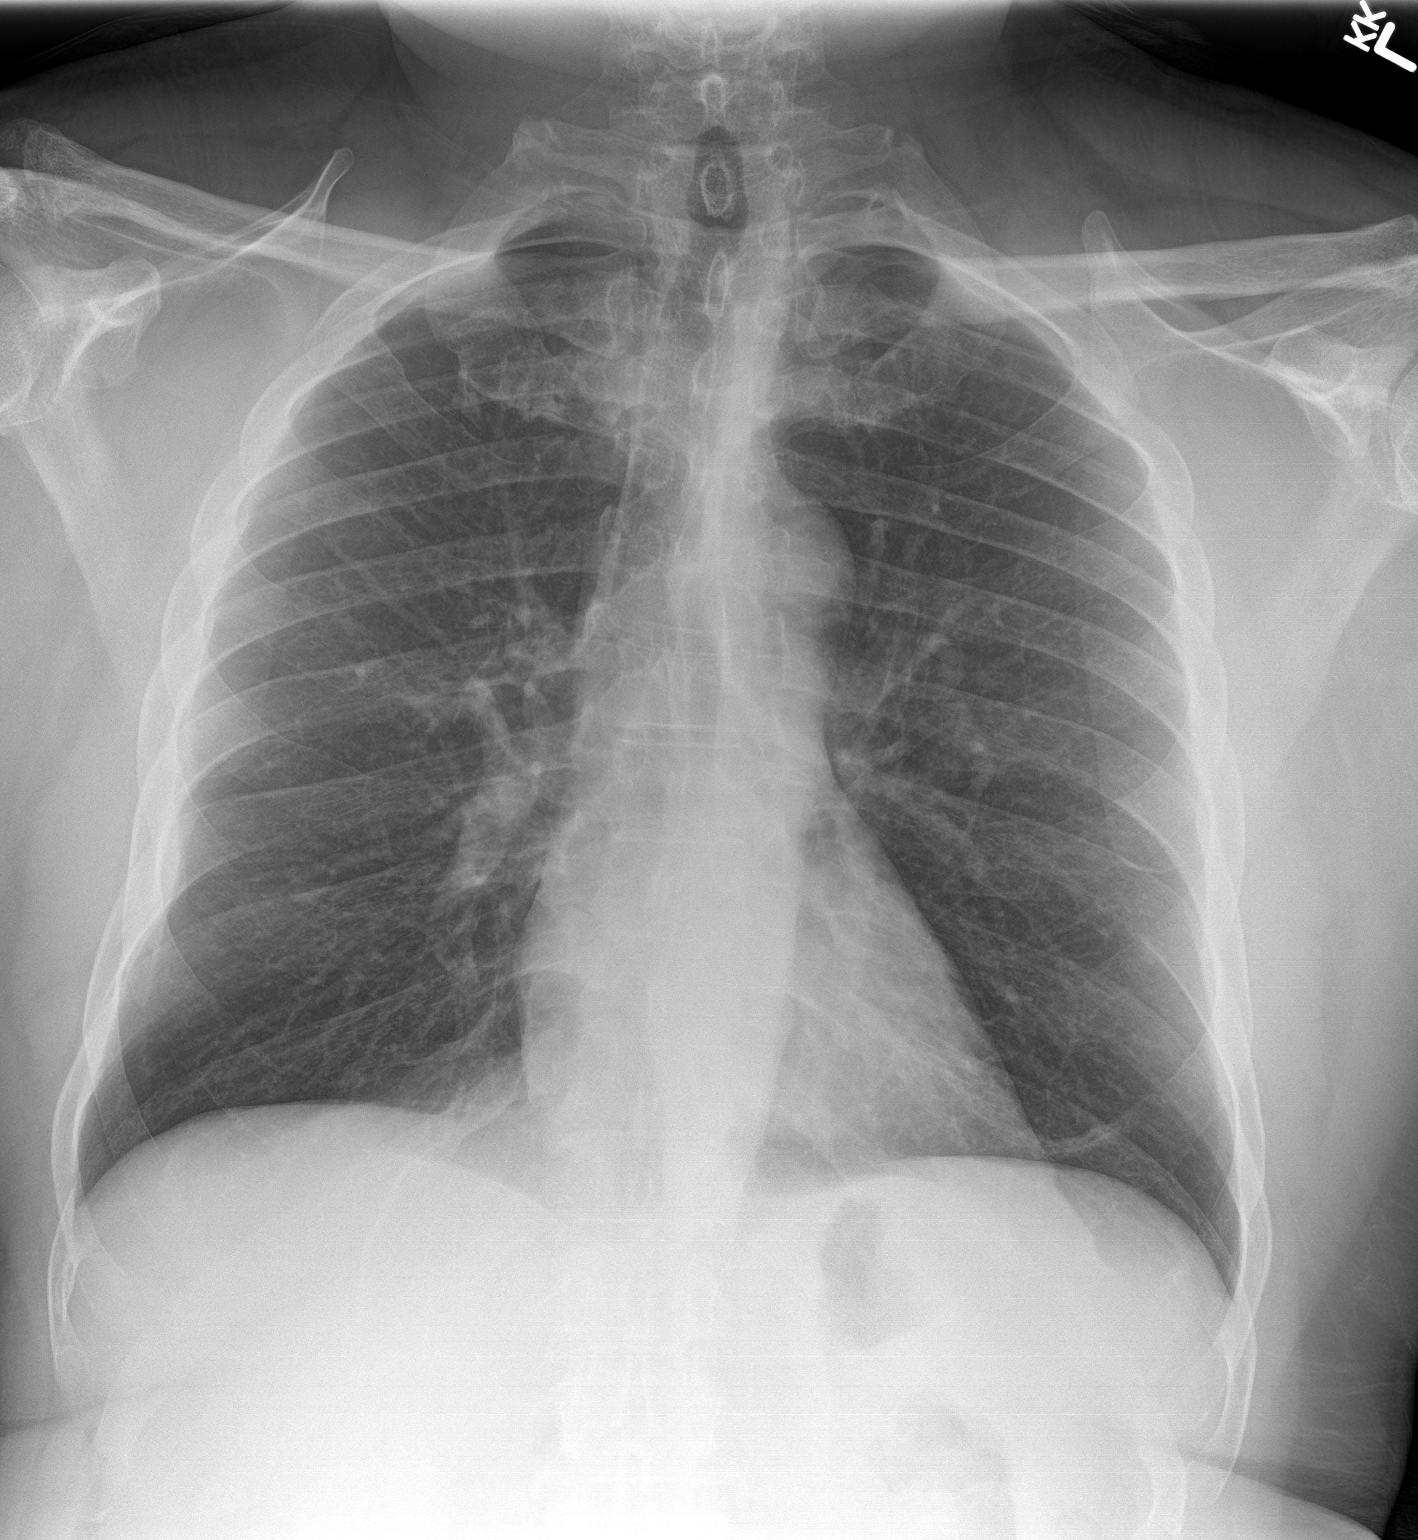
[im 2/2]
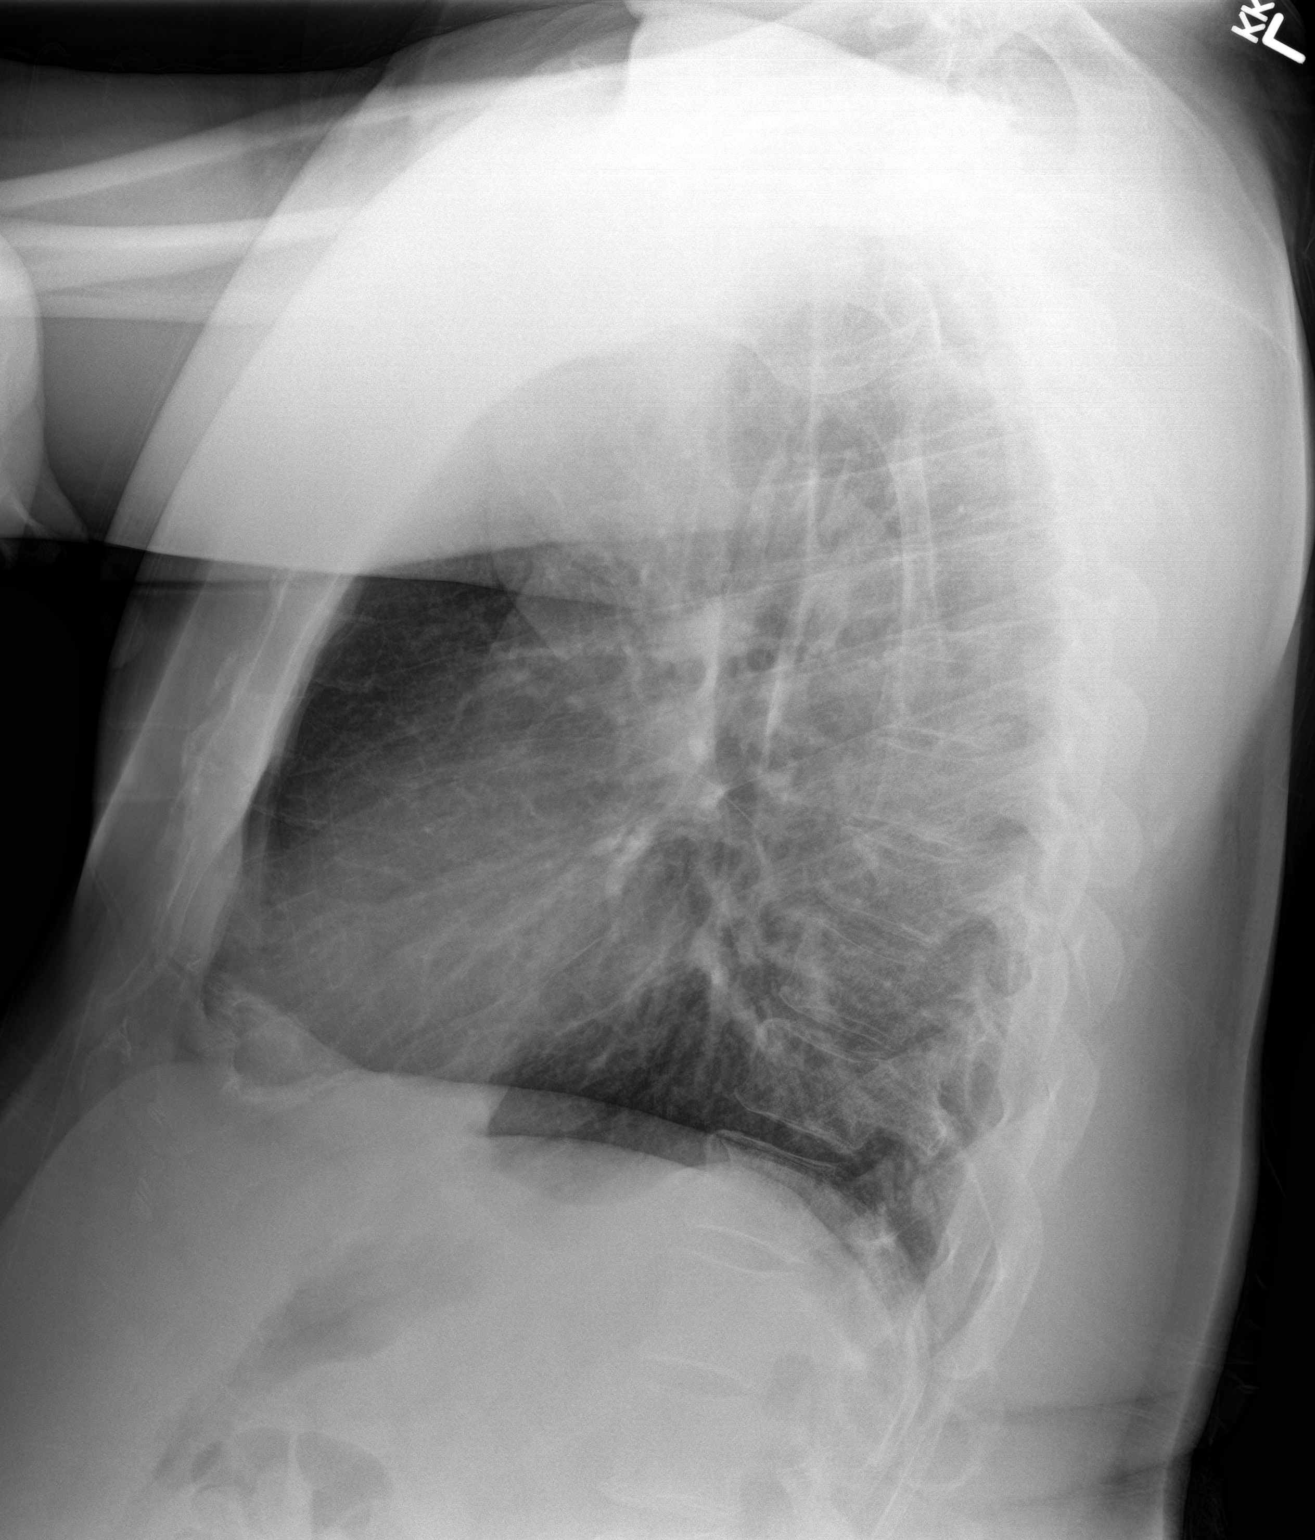

[2 of 2 positions shown; findings below may reference images not displayed]

FINDINGS: The heart size and mediastinal contours are within normal limits.
Both lungs are clear. The visualized skeletal structures are
unremarkable.
IMPRESSION: No active cardiopulmonary disease.

## 2022-08-16 ENCOUNTER — Ambulatory Visit: Payer: Medicare Other | Admitting: Nurse Practitioner

## 2022-08-25 NOTE — Progress Notes (Signed)
[facility] Quality Team Note  Name: Tony Collier Date of Birth: March 10, 1976 MRN: [id number] Date: 08/25/2022  [facility] Quality Team has reviewed this patient's chart, please see recommendations below:  [facility] Quality Other; (PATIENT IS ON TRUE [place] [employer] CONTROLLING BLOOD PRESSURE LIST. PATIENT NEEDS BP LESS THAN 140/90 FOR COMPLIANCE. UPCOMING APPT 09/10/2022)

## 2022-08-30 ENCOUNTER — Other Ambulatory Visit: Payer: Self-pay | Admitting: Nurse Practitioner

## 2022-08-30 DIAGNOSIS — G40909 Epilepsy, unspecified, not intractable, without status epilepticus: Secondary | ICD-10-CM

## 2022-08-30 NOTE — Telephone Encounter (Signed)
Ok to send next appt 09/10/22

## 2022-09-10 ENCOUNTER — Other Ambulatory Visit
Admission: RE | Admit: 2022-09-10 | Discharge: 2022-09-10 | Disposition: A | Discharge status: Home / Self Care | Payer: Medicare Other | Source: Ambulatory Visit | Attending: Nurse Practitioner | Admitting: Nurse Practitioner

## 2022-09-10 ENCOUNTER — Ambulatory Visit (INDEPENDENT_AMBULATORY_CARE_PROVIDER_SITE_OTHER): Payer: Medicare Other | Admitting: Nurse Practitioner

## 2022-09-10 ENCOUNTER — Encounter: Payer: Self-pay | Admitting: Nurse Practitioner

## 2022-09-10 VITALS — BP 135/85 | HR 60 | Temp 98.3°F | Resp 16 | Ht 66.0 in | Wt 215.0 lb

## 2022-09-10 DIAGNOSIS — E538 Deficiency of other specified B group vitamins: Secondary | ICD-10-CM

## 2022-09-10 DIAGNOSIS — E559 Vitamin D deficiency, unspecified: Secondary | ICD-10-CM

## 2022-09-10 DIAGNOSIS — Z79899 Other long term (current) drug therapy: Secondary | ICD-10-CM | POA: Insufficient documentation

## 2022-09-10 DIAGNOSIS — I10 Essential (primary) hypertension: Secondary | ICD-10-CM

## 2022-09-10 DIAGNOSIS — Z0001 Encounter for general adult medical examination with abnormal findings: Secondary | ICD-10-CM | POA: Insufficient documentation

## 2022-09-10 DIAGNOSIS — G40909 Epilepsy, unspecified, not intractable, without status epilepticus: Secondary | ICD-10-CM

## 2022-09-10 DIAGNOSIS — E782 Mixed hyperlipidemia: Secondary | ICD-10-CM | POA: Diagnosis not present

## 2022-09-10 DIAGNOSIS — Z125 Encounter for screening for malignant neoplasm of prostate: Secondary | ICD-10-CM

## 2022-09-10 DIAGNOSIS — R300 Dysuria: Secondary | ICD-10-CM

## 2022-09-10 DIAGNOSIS — Z202 Contact with and (suspected) exposure to infections with a predominantly sexual mode of transmission: Secondary | ICD-10-CM

## 2022-09-10 LAB — COMPREHENSIVE METABOLIC PANEL
ALT: 35 U/L (ref 0–44)
AST: 23 U/L (ref 15–41)
Albumin: 4 g/dL (ref 3.5–5.0)
Alkaline Phosphatase: 57 U/L (ref 38–126)
Anion gap: 10 (ref 5–15)
BUN: 14 mg/dL (ref 6–20)
CO2: 22 mmol/L (ref 22–32)
Calcium: 8.6 mg/dL — ABNORMAL LOW (ref 8.9–10.3)
Chloride: 103 mmol/L (ref 98–111)
Creatinine, Ser: 0.92 mg/dL (ref 0.61–1.24)
GFR, Estimated: >60 mL/min (ref >60)
Glucose, Bld: 101 mg/dL — ABNORMAL HIGH (ref 70–99)
Potassium: 3.5 mmol/L (ref 3.5–5.1)
Sodium: 135 mmol/L (ref 135–145)
Total Bilirubin: 0.5 mg/dL (ref 0.3–1.2)
Total Protein: 7.1 g/dL (ref 6.5–8.1)

## 2022-09-10 LAB — LIPID PANEL
Cholesterol: 114 mg/dL (ref 0–200)
HDL: 33 mg/dL — ABNORMAL LOW (ref >40)
LDL Cholesterol: 64 mg/dL (ref 0–99)
Total CHOL/HDL Ratio: 3.5 RATIO
Triglycerides: 83 mg/dL (ref <150)
VLDL: 17 mg/dL (ref 0–40)

## 2022-09-10 LAB — CBC WITH DIFFERENTIAL/PLATELET
Abs Immature Granulocytes: 0.01 10*3/uL (ref 0.00–0.07)
Basophils Absolute: 0 10*3/uL (ref 0.0–0.1)
Basophils Relative: 0 %
Eosinophils Absolute: 0.1 10*3/uL (ref 0.0–0.5)
Eosinophils Relative: 3 %
HCT: 42.9 % (ref 39.0–52.0)
Hemoglobin: 14.4 g/dL (ref 13.0–17.0)
Immature Granulocytes: 0 %
Lymphocytes Relative: 32 %
Lymphs Abs: 1.2 10*3/uL (ref 0.7–4.0)
MCH: 31.7 pg (ref 26.0–34.0)
MCHC: 33.6 g/dL (ref 30.0–36.0)
MCV: 94.5 fL (ref 80.0–100.0)
Monocytes Absolute: 0.3 10*3/uL (ref 0.1–1.0)
Monocytes Relative: 7 %
Neutro Abs: 2.2 10*3/uL (ref 1.7–7.7)
Neutrophils Relative %: 58 %
Platelets: 169 10*3/uL (ref 150–400)
RBC: 4.54 MIL/uL (ref 4.22–5.81)
RDW: 13 % (ref 11.5–15.5)
WBC: 3.8 10*3/uL — ABNORMAL LOW (ref 4.0–10.5)
nRBC: 0 % (ref 0.0–0.2)

## 2022-09-10 LAB — FOLATE: Folate: 8.9 ng/mL (ref >5.9)

## 2022-09-10 LAB — VITAMIN B12: Vitamin B-12: 389 pg/mL (ref 180–914)

## 2022-09-10 LAB — VITAMIN D 25 HYDROXY (VIT D DEFICIENCY, FRACTURES): Vit D, 25-Hydroxy: 40.94 ng/mL (ref 30–100)

## 2022-09-10 NOTE — Progress Notes (Signed)
[facility] [address] [place], [place] [address]  Internal MEDICINE  Office Visit Note  Patient Name: Tony Collier  [id number]  [id number]  Date of Service: 09/10/2022  Chief Complaint  Patient presents with   Hypertension   Medicare Wellness    HPI Tony Collier presents for an annual well visit and physical exam.  Well-appearing 47 y.o. male with seizure disorder, hypertension, and high cholesterol.  Routine CRC screening: due in 2032 Labs: due for routine labs including PSA level New or worsening pain: none  Other concerns: Sweating more than normal? Indoors and outdoors.  CTA coronary test was normal Wife had trichomonas last year but he was never treated, requesting to be tested today.  Has been having possible seizure activity at night, has appt with neurologist next Wednesday.        09/10/2022    9:14 AM 08/12/2021   10:10 AM  MMSE - Mini Mental State Exam  Orientation to time 5 5  Orientation to Place 5 5  Registration 3 3  Attention/ Calculation 5 5  Recall 3 3  Language- name 2 objects 2 2  Language- repeat 1 1  Language- follow 3 step command 3 3  Language- read & follow direction 1 1  Write a sentence 1 1  Copy design 1 1  Total score 30 30    Functional Status Survey: Is the patient deaf or have difficulty hearing?: No Does the patient have difficulty seeing, even when wearing glasses/contacts?: No Does the patient have difficulty concentrating, remembering, or making decisions?: No Does the patient have difficulty walking or climbing stairs?: No Does the patient have difficulty dressing or bathing?: No Does the patient have difficulty doing errands alone such as visiting a doctor's office or shopping?: No     08/12/2021   10:10 AM 11/12/2021    1:22 PM 11/23/2021    2:07 PM 05/06/2022    9:34 AM 09/10/2022    9:13 AM  Fall Risk  Falls in the past year? 0 0 0 0 0  Was there an injury with Fall?   0 0 0  Fall Risk Category  Calculator   0 0 0  Fall Risk Category (Retired)   Low    (RETIRED) Patient Fall Risk Level Low fall risk  Low fall risk    Patient at Risk for Falls Due to No Fall Risks  No Fall Risks No Fall Risks No Fall Risks  Fall risk Follow up Falls evaluation completed  Falls evaluation completed Falls evaluation completed Falls evaluation completed       09/10/2022    9:13 AM  Depression screen PHQ 2/9  Decreased Interest 0  Down, Depressed, Hopeless 0  PHQ - 2 Score 0       Current Medication: Outpatient Encounter Medications as of 09/10/2022  Medication Sig   albuterol (VENTOLIN HFA) 108 (90 Base) MCG/ACT inhaler Inhale 2 puffs into the lungs every 6 (six) hours as needed for wheezing or shortness of breath.   amLODipine (NORVASC) 5 MG tablet TAKE 1 TABLET DAILY FOR BLOOD PRESSURE   atorvastatin (LIPITOR) 10 MG tablet TAKE 1 TABLET DAILY   carbamazepine (TEGRETOL) 100 MG chewable tablet CHEW AND SWALLOW 4 TABLETS BY MOUTH IN THE MORNING AND IN THE EVENING   hydrochlorothiazide (HYDRODIURIL) 25 MG tablet TAKE 1 TABLET DAILY   levETIRAcetam (KEPPRA XR) 500 MG 24 hr tablet Take by mouth.   meclizine (ANTIVERT) 25 MG tablet Take 1 tablet (25 mg total)  by mouth 3 (three) times daily as needed for dizziness.   sildenafil (VIAGRA) 100 MG tablet Take 1 tablet (100 mg total) by mouth daily as needed for erectile dysfunction.   [DISCONTINUED] carbamazepine (TEGRETOL) 100 MG chewable tablet CHEW AND SWALLOW 4 TABLETS BY MOUTH IN THE MORNING AND IN THE EVENING   [DISCONTINUED] metoprolol tartrate (LOPRESSOR) 100 MG tablet Take 1 tablet (100 mg total) by mouth once for 1 dose. Take 90-120 minutes prior to scan.   No facility-administered encounter medications on file as of 09/10/2022.    Surgical History: Past Surgical History:  Procedure Laterality Date   COLONOSCOPY N/A 09/12/2020   Procedure: COLONOSCOPY;  Surgeon: Pasty Spillers, MD;  Location: [facility] ENDOSCOPY;  Service: Endoscopy;   Laterality: N/A;   FOOT SURGERY      Medical History: Past Medical History:  Diagnosis Date   Chest pain    Dizziness    Hypertension    Insomnia    Ptosis, right eyelid    Seizures (HCC)    Seizures (HCC)    Vertigo     Family History: Family History  Problem Relation Age of Onset   Hypertension Mother    Heart attack Maternal Grandfather     Social History   Socioeconomic History   Marital status: Married    Spouse name: Not on file   Number of children: 4   Years of education: Not on file   Highest education level: Not on file  Occupational History   Occupation: [employer] part time  Tobacco Use   Smoking status: Former    Packs/day: 1.00    Years: 25.00    Additional pack years: 0.00    Total pack years: 25.00    Types: Cigarettes    Quit date: 12/08/2017    Years since quitting: 4.7   Smokeless tobacco: Never  Vaping Use   Vaping Use: Never used  Substance and Sexual Activity   Alcohol use: Not Currently    Comment: ocassional   Drug use: Never   Sexual activity: Not on file  Other Topics Concern   Not on file  Social History Narrative   Not on file   Social Determinants of Health   Financial Resource Strain: Not on file  Food Insecurity: Not on file  Transportation Needs: Not on file  Physical Activity: Not on file  Stress: Not on file  Social Connections: Not on file  Intimate Partner Violence: Not on file      Review of Systems  Constitutional:  Negative for activity change, appetite change, chills, fatigue, fever and unexpected weight change.  HENT: Negative.  Negative for congestion, ear pain, rhinorrhea, sore throat and trouble swallowing.   Eyes: Negative.   Respiratory:  Positive for cough. Negative for chest tightness, shortness of breath and wheezing.   Cardiovascular: Negative.  Negative for chest pain and palpitations.  Gastrointestinal: Negative.  Negative for abdominal pain, blood in stool, constipation, diarrhea, nausea  and vomiting.  Endocrine: Negative.   Genitourinary: Negative.  Negative for difficulty urinating, dysuria, frequency, hematuria and urgency.  Musculoskeletal: Negative.  Negative for arthralgias, back pain, joint swelling, myalgias and neck pain.  Skin: Negative.  Negative for rash and wound.  Allergic/Immunologic: Negative.  Negative for immunocompromised state.  Neurological: Negative.  Negative for dizziness, seizures, numbness and headaches.  Hematological: Negative.   Psychiatric/Behavioral: Negative.  Negative for behavioral problems, self-injury and suicidal ideas. The patient is not nervous/anxious.     Vital Signs: BP (!) 140/90  Pulse 60   Temp 98.3 F (36.8 C)   Resp 16   Ht 5\' 6"  (1.676 m)   Wt 215 lb (97.5 kg)   SpO2 97%   BMI 34.70 kg/m    Physical Exam Vitals reviewed.  Constitutional:      General: He is awake. He is not in acute distress.    Appearance: Normal appearance. He is well-developed and well-groomed. He is obese. He is not ill-appearing or diaphoretic.  HENT:     Head: Normocephalic and atraumatic.     Right Ear: Tympanic membrane, ear canal and external ear normal.     Left Ear: Tympanic membrane, ear canal and external ear normal.     Nose: Nose normal.     Mouth/Throat:     Lips: Pink.     Mouth: Mucous membranes are moist.     Pharynx: Oropharynx is clear. Uvula midline. No oropharyngeal exudate.  Eyes:     General: Lids are normal. Vision grossly intact. Gaze aligned appropriately. No scleral icterus.       Right eye: No discharge.        Left eye: No discharge.     Conjunctiva/sclera: Conjunctivae normal.     Pupils: Pupils are equal, round, and reactive to light.     Funduscopic exam:    Right eye: Red reflex present.        Left eye: Red reflex present. Neck:     Thyroid: No thyromegaly.     Vascular: No JVD.     Trachea: Trachea and phonation normal. No tracheal deviation.  Cardiovascular:     Rate and Rhythm: Normal rate and  regular rhythm.     Pulses: Normal pulses.     Heart sounds: Normal heart sounds, S1 normal and S2 normal. No murmur heard.    No friction rub. No gallop.  Pulmonary:     Effort: Pulmonary effort is normal. No accessory muscle usage or respiratory distress.     Breath sounds: Normal breath sounds and air entry. No stridor. No wheezing or rales.  Chest:     Chest wall: No tenderness.  Abdominal:     General: Bowel sounds are normal. There is no distension.     Palpations: Abdomen is soft. There is no shifting dullness, fluid wave, mass or pulsatile mass.     Tenderness: There is no abdominal tenderness. There is no guarding or rebound.  Musculoskeletal:        General: No tenderness or deformity. Normal range of motion.     Cervical back: Normal range of motion and neck supple.     Right lower leg: No edema.     Left lower leg: No edema.  Lymphadenopathy:     Cervical: No cervical adenopathy.  Skin:    General: Skin is warm and dry.     Coloration: Skin is not pale.     Findings: No erythema or rash.  Neurological:     Mental Status: He is alert.     Cranial Nerves: No cranial nerve deficit.     Motor: No abnormal muscle tone.     Coordination: Coordination normal.     Deep Tendon Reflexes: Reflexes are normal and symmetric.  Psychiatric:        Behavior: Behavior normal. Behavior is cooperative.        Thought Content: Thought content normal.        Judgment: Judgment normal.        Assessment/Plan: 1. Encounter for routine  adult health examination with abnormal findings Age-appropriate preventive screenings and vaccinations discussed, annual physical exam completed. Routine labs for health maintenance ordered, see below. PHM updated.  - PSA Total (Reflex To Free) - CBC with Differential/Platelet - CMP14+EGFR - Lipid Profile - Carbamazepine level, total; Future - B12 and Folate Panel - Vitamin D (25 hydroxy)  2. Essential hypertension Routine labs ordered  - CBC  with Differential/Platelet - CMP14+EGFR  3. Mixed hyperlipidemia Routine labs ordered  - CBC with Differential/Platelet - CMP14+EGFR - Lipid Profile  4. B12 deficiency Routine lab ordered  - B12 and Folate Panel  5. Vitamin D deficiency Routine lab ordered  - Vitamin D (25 hydroxy)  6. Seizure disorder (HCC) Continue carbamazepine as prescribed, follow up with neurology on wednesday - Carbamazepine level, total; Future  7. Dysuria Routine urinalysis done  - Urinalysis, Routine w reflex microscopic  8. Exposure to trichomonas Urine test ordered  - Chlamydia/Gonococcus/Trichomonas, NAA  9. Screening for prostate cancer Routine PSA level ordered  - PSA Total (Reflex To Free)  10. On carbamazepine therapy Routine drug level ordered  - Carbamazepine level, total; Future     General Counseling: Tony Collier verbalizes understanding of the findings of todays visit and agrees with plan of treatment. I have discussed any further diagnostic evaluation that may be needed or ordered today. We also reviewed his medications today. he has been encouraged to call the office with any questions or concerns that should arise related to todays visit.    Orders Placed This Encounter  Procedures   Chlamydia/Gonococcus/Trichomonas, NAA   Urinalysis, Routine w reflex microscopic   PSA Total (Reflex To Free)   CBC with Differential/Platelet   CMP14+EGFR   Lipid Profile   Carbamazepine level, total   B12 and Folate Panel   Vitamin D (25 hydroxy)    No orders of the defined types were placed in this encounter.   Return in about 6 months (around 03/12/2023) for F/U, Tony Collier PCP.   Total time spent:30 Minutes Time spent includes review of chart, medications, test results, and follow up plan with the patient.   [place] Controlled Substance Database was reviewed by me.  This patient was seen by Sallyanne Kuster, FNP-C in collaboration with Dr. Beverely Risen as a part of collaborative care  agreement.  Tony Collier R. Tedd Sias, MSN, FNP-C Internal medicine

## 2022-09-13 NOTE — Progress Notes (Signed)
Please let patient know his results: CMP normal except slightly low calcium, may take OTC calcium supplement, 600 mg daily.  Cholesterol improved compared to last year.  CBC is normal B12 low normal, may take OTC B12 supplement 1000 mcg daily Vitamin D and folate are normal.

## 2022-09-14 ENCOUNTER — Telehealth: Payer: Self-pay

## 2022-09-14 LAB — CHLAMYDIA/GONOCOCCUS/TRICHOMONAS, NAA
Chlamydia by NAA: NEGATIVE
Gonococcus by NAA: NEGATIVE
Trich vag by NAA: NEGATIVE

## 2022-09-14 LAB — PSA (REFLEX TO FREE) (SERIAL): Prostate Specific Ag, Serum: 0.3 ng/mL (ref 0.0–4.0)

## 2022-09-14 NOTE — Telephone Encounter (Signed)
-----   Message from Sallyanne Kuster, NP sent at 09/14/2022  7:29 AM EDT ----- Urine is negative for chlamydia, gonorrhea and trichomonas.   PSA is normal.

## 2022-09-14 NOTE — Telephone Encounter (Signed)
Patient wife notified.

## 2022-09-14 NOTE — Telephone Encounter (Signed)
-----   Message from Sallyanne Kuster, NP sent at 09/13/2022  7:33 AM EDT ----- Please let patient know his results: CMP normal except slightly low calcium, may take OTC calcium supplement, 600 mg daily.  Cholesterol improved compared to last year.  CBC is normal B12 low normal, may take OTC B12 supplement 1000 mcg daily Vitamin D and folate are normal.

## 2022-09-14 NOTE — Telephone Encounter (Signed)
Sent mychart message and also spoke with wife that we send mychart please let him check

## 2022-09-14 NOTE — Progress Notes (Signed)
Urine is negative for chlamydia, gonorrhea and trichomonas.   PSA is normal.

## 2022-10-25 ENCOUNTER — Other Ambulatory Visit: Payer: Self-pay | Admitting: Nurse Practitioner

## 2022-10-25 DIAGNOSIS — R300 Dysuria: Secondary | ICD-10-CM

## 2022-10-25 DIAGNOSIS — M545 Low back pain, unspecified: Secondary | ICD-10-CM

## 2022-11-22 ENCOUNTER — Other Ambulatory Visit: Payer: Self-pay | Admitting: Nurse Practitioner

## 2022-11-22 DIAGNOSIS — G40909 Epilepsy, unspecified, not intractable, without status epilepticus: Secondary | ICD-10-CM

## 2022-12-27 ENCOUNTER — Other Ambulatory Visit: Payer: Self-pay | Admitting: Nurse Practitioner

## 2022-12-27 DIAGNOSIS — G40909 Epilepsy, unspecified, not intractable, without status epilepticus: Secondary | ICD-10-CM

## 2023-02-14 IMAGING — CR DG CHEST 2V
1 series · 2 of 2 positions shown · non-contrast
Comparison: Chest radiograph 02/10/2021

CLINICAL DATA: Persistent cough for 1 month.

EXAM:
CHEST - 2 VIEW

[Series 1: dg chest 2 view · 0.14mm/px · 2 of 2 slices shown]
[im 1/2]
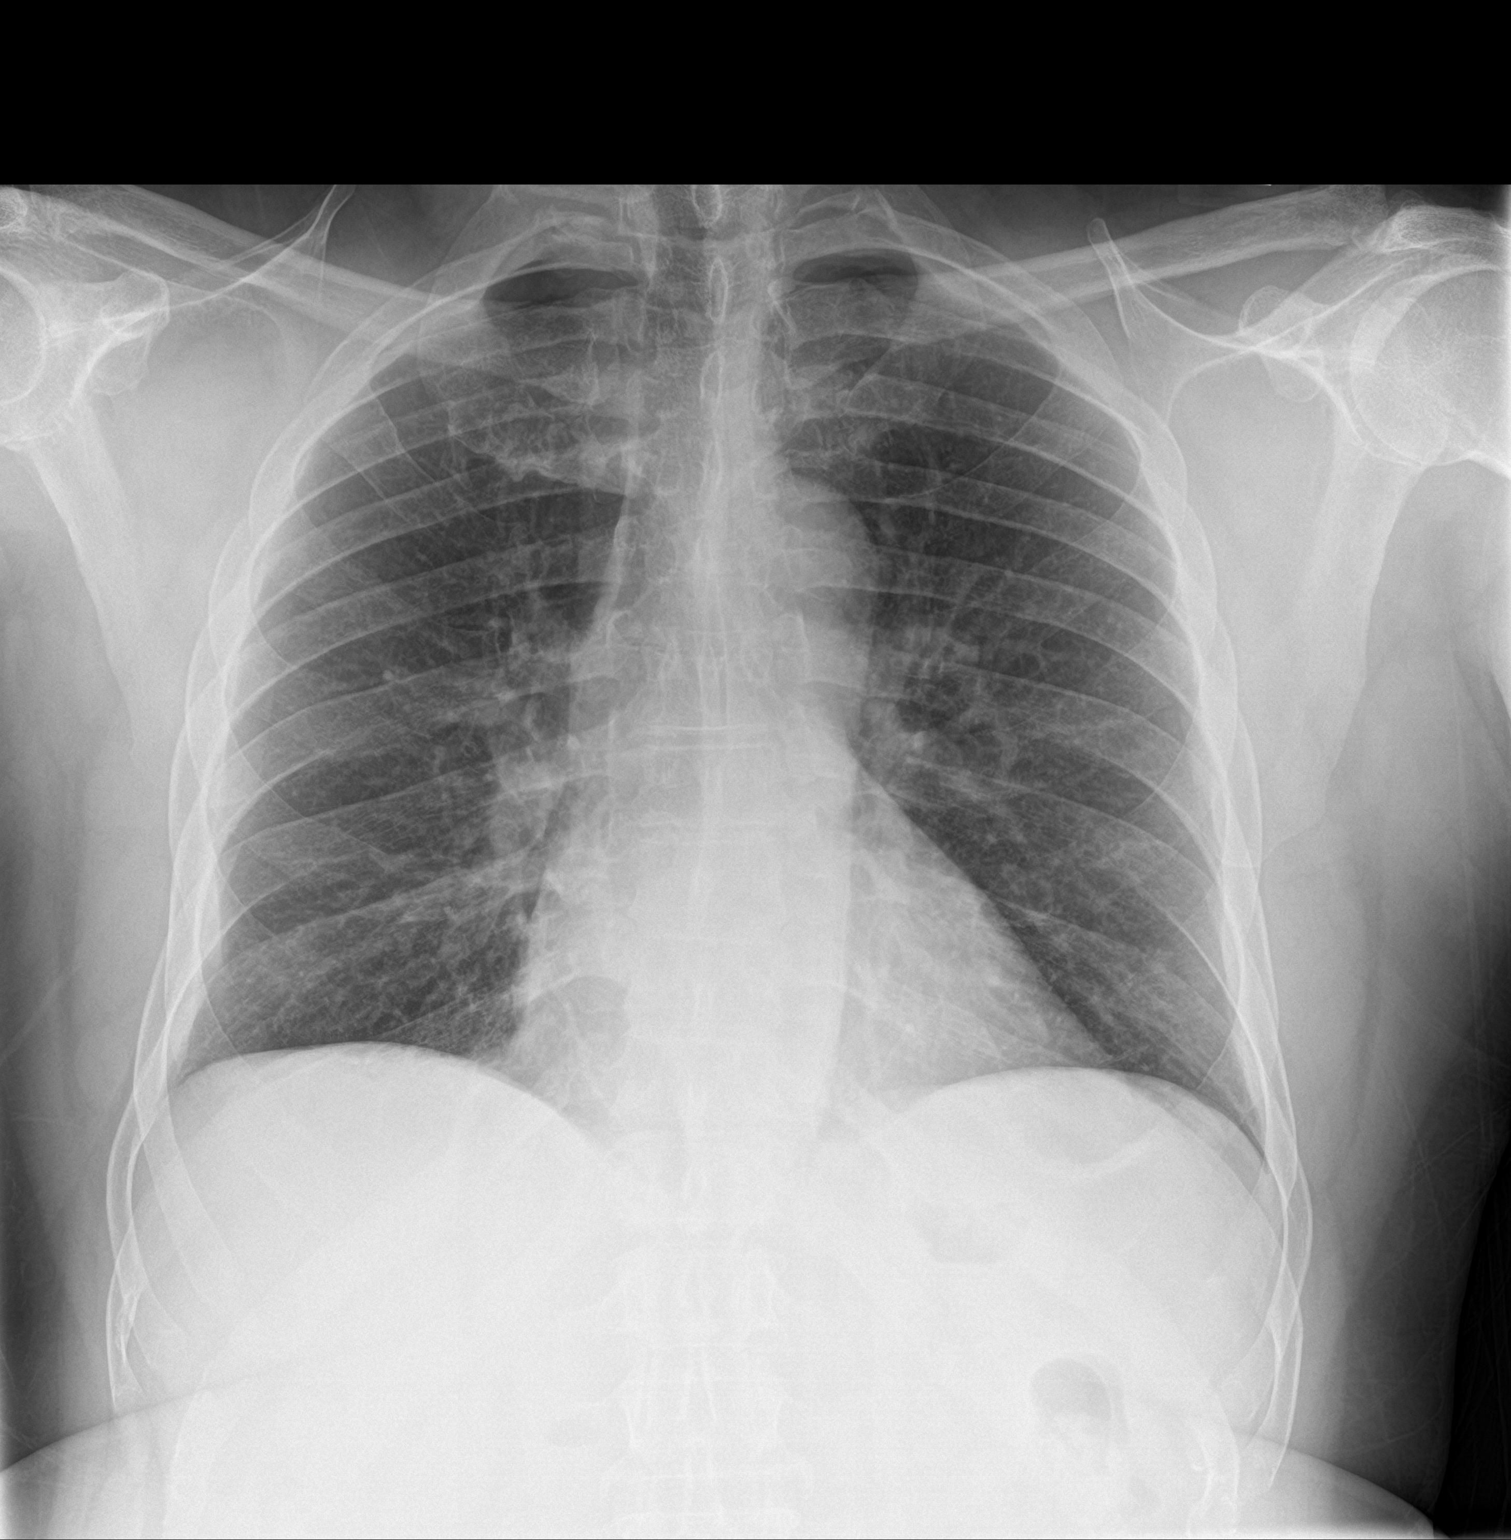
[im 2/2]
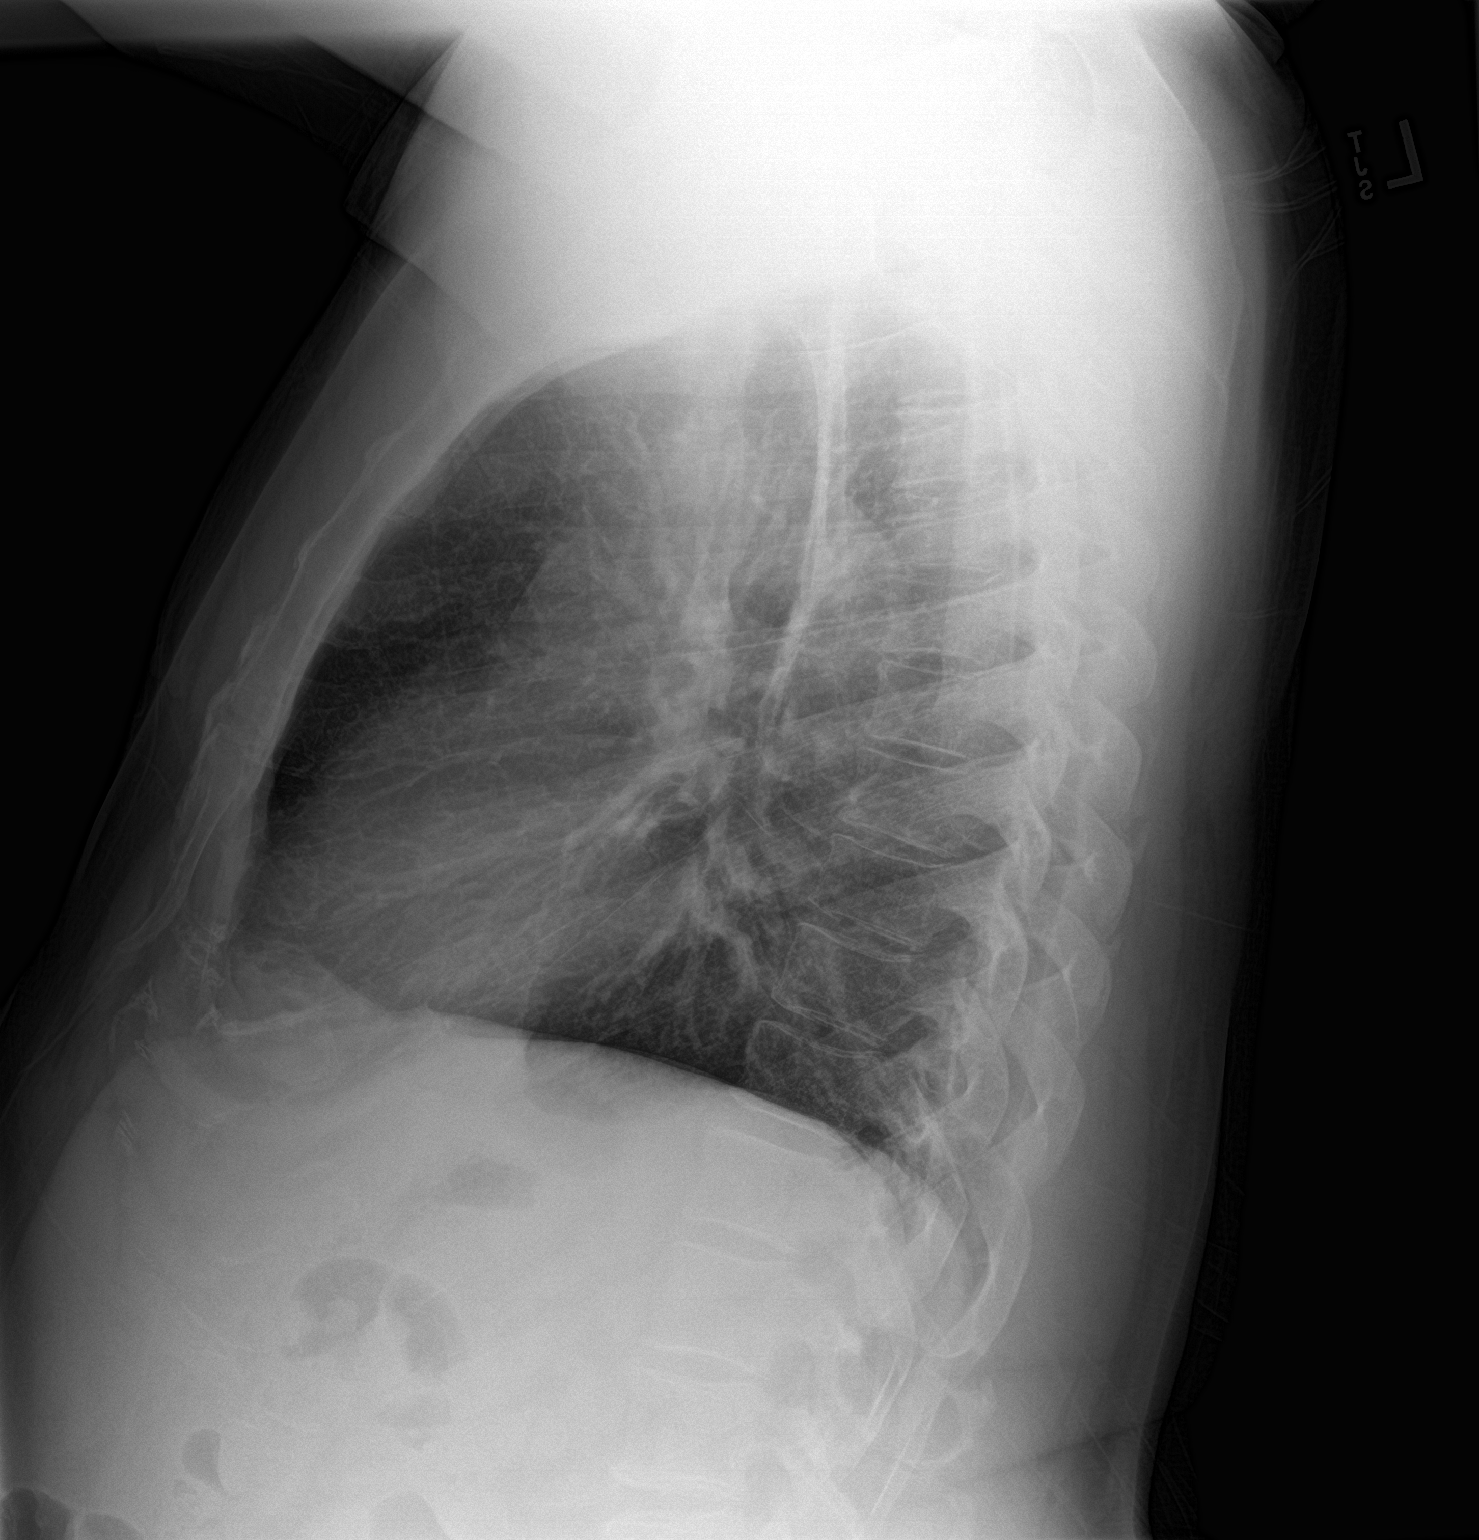

[2 of 2 positions shown; findings below may reference images not displayed]

FINDINGS: The heart size and mediastinal contours are within normal limits.
Both lungs are clear. The visualized skeletal structures are
unremarkable.
IMPRESSION: No active cardiopulmonary disease.

## 2023-02-28 ENCOUNTER — Ambulatory Visit (INDEPENDENT_AMBULATORY_CARE_PROVIDER_SITE_OTHER): Payer: Medicare Other | Admitting: Physician Assistant

## 2023-02-28 ENCOUNTER — Encounter: Payer: Self-pay | Admitting: Physician Assistant

## 2023-02-28 VITALS — BP 140/100 | HR 72 | Temp 98.3°F | Resp 16 | Ht 66.0 in | Wt 218.2 lb

## 2023-02-28 DIAGNOSIS — J0110 Acute frontal sinusitis, unspecified: Secondary | ICD-10-CM | POA: Diagnosis not present

## 2023-02-28 MED ORDER — AMOXICILLIN-POT CLAVULANATE 875-125 MG PO TABS
1.0000 | ORAL_TABLET | Freq: Two times a day (BID) | ORAL | 0 refills | Status: DC
Start: 2023-02-28 — End: 2023-08-17

## 2023-02-28 MED ORDER — FLUTICASONE PROPIONATE 50 MCG/ACT NA SUSP: 2.0000 | Freq: Every day | NASAL | 6 refills | Status: DC

## 2023-02-28 NOTE — Progress Notes (Unsigned)
  [facility] [address] [place], [place] [address]  Internal MEDICINE  Office Visit Note  Patient Name: Tony Collier  [id number]  [id number]  Date of Service: 02/28/2023  Chief Complaint  Patient presents with   Acute Visit   Ear Fullness    Right side ear fullness for 2-3 weeks.     HPI Pt is here for a sick visit. -Fullness in right ear for 3-4 weeks now -having trouble getting it to pop, no pain -has been using a nasal spray (azelastine) -  Current Medication:  Outpatient Encounter Medications as of 02/28/2023  Medication Sig   albuterol (VENTOLIN HFA) 108 (90 Base) MCG/ACT inhaler Inhale 2 puffs into the lungs every 6 (six) hours as needed for wheezing or shortness of breath.   amLODipine (NORVASC) 5 MG tablet TAKE 1 TABLET DAILY FOR BLOOD PRESSURE   atorvastatin (LIPITOR) 10 MG tablet TAKE 1 TABLET DAILY   carbamazepine (TEGRETOL) 100 MG chewable tablet CHEW AND SWALLOW 4 TABLETS BY MOUTH EVERY MORNING AND IN THE EVENING   hydrochlorothiazide (HYDRODIURIL) 25 MG tablet TAKE 1 TABLET DAILY   meclizine (ANTIVERT) 25 MG tablet Take 1 tablet (25 mg total) by mouth 3 (three) times daily as needed for dizziness.   meloxicam (MOBIC) 15 MG tablet TAKE 1 TABLET DAILY   sildenafil (VIAGRA) 100 MG tablet Take 1 tablet (100 mg total) by mouth daily as needed for erectile dysfunction.   No facility-administered encounter medications on file as of 02/28/2023.      Medical History: Past Medical History:  Diagnosis Date   Chest pain    Dizziness    Hypertension    Insomnia    Ptosis, right eyelid    Seizures (HCC)    Seizures (HCC)    Vertigo      Vital Signs: BP (!) 140/100   Pulse 72   Temp 98.3 F (36.8 C)   Resp 16   Ht 5\' 6"  (1.676 m)   Wt 218 lb 3.2 oz (99 kg)   SpO2 96%   BMI 35.22 kg/m    Review of Systems  Physical Exam    Assessment/Plan:   General Counseling: Denys verbalizes understanding of the findings of todays  visit and agrees with plan of treatment. I have discussed any further diagnostic evaluation that may be needed or ordered today. We also reviewed his medications today. he has been encouraged to call the office with any questions or concerns that should arise related to todays visit.    Counseling:    No orders of the defined types were placed in this encounter.   No orders of the defined types were placed in this encounter.   Time spent:*** Minutes

## 2023-03-11 ENCOUNTER — Ambulatory Visit: Payer: Medicare Other | Admitting: Nurse Practitioner

## 2023-03-18 ENCOUNTER — Telehealth: Payer: Self-pay

## 2023-03-18 DIAGNOSIS — H938X3 Other specified disorders of ear, bilateral: Secondary | ICD-10-CM

## 2023-03-18 DIAGNOSIS — J0110 Acute frontal sinusitis, unspecified: Secondary | ICD-10-CM

## 2023-03-23 ENCOUNTER — Telehealth: Payer: Self-pay

## 2023-03-23 ENCOUNTER — Telehealth: Payer: Self-pay | Admitting: Nurse Practitioner

## 2023-03-23 NOTE — Telephone Encounter (Signed)
 Pt advised that we sent referral

## 2023-03-23 NOTE — Telephone Encounter (Signed)
 Patient was notified that ENT referral was placed.

## 2023-03-23 NOTE — Telephone Encounter (Signed)
 Otolaryngology referral faxed to [facility]; [phone]

## 2023-03-23 NOTE — Telephone Encounter (Signed)
 Left message for patient to give office a call back to let him know an ENT referral was placed today.

## 2023-04-26 ENCOUNTER — Other Ambulatory Visit: Payer: Self-pay | Admitting: Nurse Practitioner

## 2023-04-26 DIAGNOSIS — G40909 Epilepsy, unspecified, not intractable, without status epilepticus: Secondary | ICD-10-CM

## 2023-04-26 NOTE — Telephone Encounter (Signed)
Please review and send

## 2023-05-23 ENCOUNTER — Other Ambulatory Visit: Payer: Self-pay | Admitting: Nurse Practitioner

## 2023-05-23 DIAGNOSIS — E782 Mixed hyperlipidemia: Secondary | ICD-10-CM

## 2023-05-23 DIAGNOSIS — I10 Essential (primary) hypertension: Secondary | ICD-10-CM

## 2023-07-04 ENCOUNTER — Other Ambulatory Visit: Payer: Self-pay

## 2023-07-04 DIAGNOSIS — I10 Essential (primary) hypertension: Secondary | ICD-10-CM

## 2023-07-04 MED ORDER — HYDROCHLOROTHIAZIDE 25 MG PO TABS: 25.0000 mg | ORAL_TABLET | Freq: Every day | ORAL | 3 refills | Status: DC

## 2023-07-14 ENCOUNTER — Other Ambulatory Visit: Payer: Self-pay | Admitting: Nurse Practitioner

## 2023-07-14 DIAGNOSIS — G40909 Epilepsy, unspecified, not intractable, without status epilepticus: Secondary | ICD-10-CM

## 2023-07-14 NOTE — Telephone Encounter (Signed)
 Please review and send

## 2023-08-11 ENCOUNTER — Telehealth: Payer: Self-pay | Admitting: Nurse Practitioner

## 2023-08-11 NOTE — Telephone Encounter (Signed)
 Per request, 07/18/22 to present MR faxed to [facility]; [phone]

## 2023-08-17 ENCOUNTER — Encounter: Payer: Self-pay | Admitting: Nurse Practitioner

## 2023-08-17 ENCOUNTER — Ambulatory Visit (INDEPENDENT_AMBULATORY_CARE_PROVIDER_SITE_OTHER): Admitting: Nurse Practitioner

## 2023-08-17 VITALS — BP 128/80 | HR 71 | Temp 98.4°F | Resp 16 | Ht 66.0 in | Wt 215.0 lb

## 2023-08-17 DIAGNOSIS — G40909 Epilepsy, unspecified, not intractable, without status epilepticus: Secondary | ICD-10-CM

## 2023-08-17 DIAGNOSIS — R7301 Impaired fasting glucose: Secondary | ICD-10-CM

## 2023-08-17 DIAGNOSIS — Z79899 Other long term (current) drug therapy: Secondary | ICD-10-CM | POA: Insufficient documentation

## 2023-08-17 DIAGNOSIS — R82998 Other abnormal findings in urine: Secondary | ICD-10-CM | POA: Diagnosis not present

## 2023-08-17 DIAGNOSIS — Z125 Encounter for screening for malignant neoplasm of prostate: Secondary | ICD-10-CM

## 2023-08-17 DIAGNOSIS — E538 Deficiency of other specified B group vitamins: Secondary | ICD-10-CM

## 2023-08-17 DIAGNOSIS — R809 Proteinuria, unspecified: Secondary | ICD-10-CM | POA: Insufficient documentation

## 2023-08-17 DIAGNOSIS — E559 Vitamin D deficiency, unspecified: Secondary | ICD-10-CM

## 2023-08-17 DIAGNOSIS — E782 Mixed hyperlipidemia: Secondary | ICD-10-CM

## 2023-08-17 DIAGNOSIS — I10 Essential (primary) hypertension: Secondary | ICD-10-CM

## 2023-08-17 LAB — POCT URINALYSIS DIPSTICK
Bilirubin, UA: NEGATIVE
Blood, UA: NEGATIVE
Glucose, UA: NEGATIVE
Ketones, UA: POSITIVE
Leukocytes, UA: NEGATIVE
Nitrite, UA: NEGATIVE
Protein, UA: POSITIVE — AB
Spec Grav, UA: 1.01 (ref 1.010–1.025)
Urobilinogen, UA: 0.2 U/dL
pH, UA: 5 (ref 5.0–8.0)

## 2023-08-17 MED ORDER — CARBAMAZEPINE 100 MG PO CHEW
CHEWABLE_TABLET | ORAL | 0 refills | Status: DC
Start: 2023-08-17 — End: 2023-09-13

## 2023-08-17 NOTE — Progress Notes (Signed)
 [facility] [address] [place], [place] [address]  Internal MEDICINE  Office Visit Note  Patient Name: Tony Collier  [id number]  [id number]  Date of Service: 08/17/2023  Chief Complaint  Patient presents with   Acute Visit   Urinary Tract Infection    Patient says he has no symptoms aside from odor and dark urine. Urine dip was clear.     HPI Tony Collier presents for an acute sick visit for dark urine with strong odor.  Urinalysis done and was negative for everything except ketones and protein.   Has upcoming annual visit next month, needs routine labs ordered.   Current Medication:  Outpatient Encounter Medications as of 08/17/2023  Medication Sig   albuterol  (VENTOLIN  HFA) 108 (90 Base) MCG/ACT inhaler Inhale 2 puffs into the lungs every 6 (six) hours as needed for wheezing or shortness of breath.   amLODipine  (NORVASC ) 5 MG tablet TAKE 1 TABLET DAILY FOR BLOOD PRESSURE   atorvastatin  (LIPITOR) 10 MG tablet TAKE 1 TABLET DAILY   fluticasone  (FLONASE ) 50 MCG/ACT nasal spray Place 2 sprays into both nostrils daily.   hydrochlorothiazide  (HYDRODIURIL ) 25 MG tablet Take 1 tablet (25 mg total) by mouth daily.   sildenafil  (VIAGRA ) 100 MG tablet Take 1 tablet (100 mg total) by mouth daily as needed for erectile dysfunction.   [DISCONTINUED] amoxicillin -clavulanate (AUGMENTIN ) 875-125 MG tablet Take 1 tablet by mouth 2 (two) times daily. Take with food.   [DISCONTINUED] carbamazepine  (TEGRETOL ) 100 MG chewable tablet CHEW AND SWALLOW 4 TABLETS BY MOUTH EVERY MORNING AND EVERY EVENING   carbamazepine  (TEGRETOL ) 100 MG chewable tablet CHEW AND SWALLOW 4 TABLETS BY MOUTH EVERY MORNING AND EVERY EVENING   No facility-administered encounter medications on file as of 08/17/2023.      Medical History: Past Medical History:  Diagnosis Date   Chest pain    Dizziness    Hypertension    Insomnia    Ptosis, right eyelid    Seizures (HCC)    Seizures (HCC)    Vertigo       Vital Signs: BP 128/80   Pulse 71   Temp 98.4 F (36.9 C)   Resp 16   Ht 5\' 6"  (1.676 m)   Wt 215 lb (97.5 kg)   SpO2 97%   BMI 34.70 kg/m    Review of Systems  Constitutional:  Negative for fatigue.  Respiratory: Negative.  Negative for cough, chest tightness, shortness of breath and wheezing.   Cardiovascular:  Negative for chest pain and palpitations.  Gastrointestinal: Negative.   Genitourinary:  Positive for decreased urine volume.       Dark urine with odor  Musculoskeletal: Negative.   Neurological: Negative.     Physical Exam Vitals reviewed.  Constitutional:      General: He is not in acute distress.    Appearance: Normal appearance. He is obese. He is not ill-appearing.  HENT:     Head: Normocephalic and atraumatic.  Eyes:     Pupils: Pupils are equal, round, and reactive to light.  Pulmonary:     Effort: Pulmonary effort is normal. No respiratory distress.  Neurological:     Mental Status: He is alert and oriented to person, place, and time.  Psychiatric:        Mood and Affect: Mood normal.        Behavior: Behavior normal.      Assessment/Plan: 1. Proteinuria, unspecified type Urine microalbumin creatinine ratio ordered.  - Urine Microalbumin w/creat. ratio  2. Dark urine Urinalysis negative except for protein and ketones - POCT Urinalysis Dipstick  3. Impaired fasting glucose (Primary) Routine labs ordered  - CBC with Differential/Platelet - CMP14+EGFR - Lipid Profile - Hgb A1C w/o eAG  4. Seizure disorder (HCC) Carbamazepine  level ordered and refills ordered  - carbamazepine  (TEGRETOL ) 100 MG chewable tablet; CHEW AND SWALLOW 4 TABLETS BY MOUTH EVERY MORNING AND EVERY EVENING  Dispense: 720 tablet; Refill: 0 - Carbamazepine  Level (Tegretol ), total  5. Essential hypertension Routine labs ordered. Continue amlodipine  and hydrochlorothiazide  as prescribed.  - CBC with Differential/Platelet - CMP14+EGFR - Lipid Profile  6.  Mixed hyperlipidemia Routine labs ordered. Continue atorvastatin  as prescribed.  - CBC with Differential/Platelet - CMP14+EGFR - Lipid Profile - Hgb A1C w/o eAG  7. B12 deficiency Routine labs ordered  - CBC with Differential/Platelet - B12 and Folate Panel  8. Vitamin D  deficiency Routine lab ordered - Vitamin D  (25 hydroxy)  9. On carbamazepine  therapy Routine lab ordered. - Carbamazepine  Level (Tegretol ), total  10. Screening for prostate cancer Routine labs ordered  - PSA Total (Reflex To Free)   General Counseling: Tony Collier understanding of the findings of todays visit and agrees with plan of treatment. I have discussed any further diagnostic evaluation that may be needed or ordered today. We also reviewed his medications today. he has been encouraged to call the office with any questions or concerns that should arise related to todays visit.    Counseling:    Orders Placed This Encounter  Procedures   Urine Microalbumin w/creat. ratio   POCT Urinalysis Dipstick    Meds ordered this encounter  Medications   carbamazepine  (TEGRETOL ) 100 MG chewable tablet    Sig: CHEW AND SWALLOW 4 TABLETS BY MOUTH EVERY MORNING AND EVERY EVENING    Dispense:  720 tablet    Refill:  0    Return if symptoms worsen or fail to improve.  [place] Controlled Substance Database was reviewed by me for overdose risk score (ORS)  Time spent:30 Minutes Time spent with patient included reviewing progress notes, labs, imaging studies, and discussing plan for follow up.   This patient was seen by Laurence Pons, FNP-C in collaboration with Dr. Verneta Gone as a part of collaborative care agreement.  Tony Nakatani R. Bobbi Burow, MSN, FNP-C Internal Medicine

## 2023-08-18 LAB — MICROALBUMIN / CREATININE URINE RATIO
Creatinine, Urine: 232.6 mg/dL
Microalb/Creat Ratio: 19 mg/g{creat} (ref 0–29)
Microalbumin, Urine: 45 ug/mL

## 2023-09-12 ENCOUNTER — Other Ambulatory Visit
Admission: RE | Admit: 2023-09-12 | Discharge: 2023-09-12 | Disposition: A | Discharge status: Home / Self Care | Attending: Nurse Practitioner | Admitting: Nurse Practitioner

## 2023-09-12 DIAGNOSIS — I10 Essential (primary) hypertension: Secondary | ICD-10-CM | POA: Diagnosis not present

## 2023-09-12 DIAGNOSIS — Z79899 Other long term (current) drug therapy: Secondary | ICD-10-CM | POA: Insufficient documentation

## 2023-09-12 DIAGNOSIS — E538 Deficiency of other specified B group vitamins: Secondary | ICD-10-CM | POA: Insufficient documentation

## 2023-09-12 DIAGNOSIS — R7301 Impaired fasting glucose: Secondary | ICD-10-CM | POA: Diagnosis present

## 2023-09-12 DIAGNOSIS — Z125 Encounter for screening for malignant neoplasm of prostate: Secondary | ICD-10-CM | POA: Insufficient documentation

## 2023-09-12 DIAGNOSIS — R809 Proteinuria, unspecified: Secondary | ICD-10-CM | POA: Diagnosis not present

## 2023-09-12 DIAGNOSIS — E782 Mixed hyperlipidemia: Secondary | ICD-10-CM | POA: Insufficient documentation

## 2023-09-12 DIAGNOSIS — G40909 Epilepsy, unspecified, not intractable, without status epilepticus: Secondary | ICD-10-CM | POA: Insufficient documentation

## 2023-09-12 DIAGNOSIS — E559 Vitamin D deficiency, unspecified: Secondary | ICD-10-CM | POA: Insufficient documentation

## 2023-09-12 LAB — CBC WITH DIFFERENTIAL/PLATELET
Abs Immature Granulocytes: 0 10*3/uL (ref 0.00–0.07)
Basophils Absolute: 0 10*3/uL (ref 0.0–0.1)
Basophils Relative: 0 %
Eosinophils Absolute: 0.1 10*3/uL (ref 0.0–0.5)
Eosinophils Relative: 3 %
HCT: 41.1 % (ref 39.0–52.0)
Hemoglobin: 14 g/dL (ref 13.0–17.0)
Immature Granulocytes: 0 %
Lymphocytes Relative: 31 %
Lymphs Abs: 1.2 10*3/uL (ref 0.7–4.0)
MCH: 32 pg (ref 26.0–34.0)
MCHC: 34.1 g/dL (ref 30.0–36.0)
MCV: 93.8 fL (ref 80.0–100.0)
Monocytes Absolute: 0.3 10*3/uL (ref 0.1–1.0)
Monocytes Relative: 7 %
Neutro Abs: 2.1 10*3/uL (ref 1.7–7.7)
Neutrophils Relative %: 59 %
Platelets: 166 10*3/uL (ref 150–400)
RBC: 4.38 MIL/uL (ref 4.22–5.81)
RDW: 12.5 % (ref 11.5–15.5)
WBC: 3.7 10*3/uL — ABNORMAL LOW (ref 4.0–10.5)
nRBC: 0 % (ref 0.0–0.2)

## 2023-09-12 LAB — COMPREHENSIVE METABOLIC PANEL WITH GFR
ALT: 26 U/L (ref 0–44)
AST: 20 U/L (ref 15–41)
Albumin: 3.7 g/dL (ref 3.5–5.0)
Alkaline Phosphatase: 57 U/L (ref 38–126)
Anion gap: 8 (ref 5–15)
BUN: 13 mg/dL (ref 6–20)
CO2: 25 mmol/L (ref 22–32)
Calcium: 8.8 mg/dL — ABNORMAL LOW (ref 8.9–10.3)
Chloride: 106 mmol/L (ref 98–111)
Creatinine, Ser: 0.92 mg/dL (ref 0.61–1.24)
GFR, Estimated: >60 mL/min (ref >60)
Glucose, Bld: 113 mg/dL — ABNORMAL HIGH (ref 70–99)
Potassium: 3.6 mmol/L (ref 3.5–5.1)
Sodium: 139 mmol/L (ref 135–145)
Total Bilirubin: 0.6 mg/dL (ref 0.0–1.2)
Total Protein: 6.8 g/dL (ref 6.5–8.1)

## 2023-09-12 LAB — HEMOGLOBIN A1C
Hgb A1c MFr Bld: 5.5 % (ref 4.8–5.6)
Mean Plasma Glucose: 111.15 mg/dL

## 2023-09-12 LAB — LIPID PANEL
Cholesterol: 103 mg/dL (ref 0–200)
HDL: 30 mg/dL — ABNORMAL LOW (ref >40)
LDL Cholesterol: 64 mg/dL (ref 0–99)
Total CHOL/HDL Ratio: 3.4 ratio
Triglycerides: 46 mg/dL (ref <150)
VLDL: 9 mg/dL (ref 0–40)

## 2023-09-12 LAB — VITAMIN D 25 HYDROXY (VIT D DEFICIENCY, FRACTURES): Vit D, 25-Hydroxy: 35.52 ng/mL (ref 30–100)

## 2023-09-12 LAB — VITAMIN B12: Vitamin B-12: 411 pg/mL (ref 180–914)

## 2023-09-12 LAB — FOLATE: Folate: 11.5 ng/mL (ref >5.9)

## 2023-09-12 LAB — CARBAMAZEPINE LEVEL, TOTAL: Carbamazepine Lvl: 11.2 ug/mL (ref 4.0–12.0)

## 2023-09-13 ENCOUNTER — Ambulatory Visit (INDEPENDENT_AMBULATORY_CARE_PROVIDER_SITE_OTHER): Payer: Medicare Other | Admitting: Nurse Practitioner

## 2023-09-13 ENCOUNTER — Encounter: Payer: Self-pay | Admitting: Nurse Practitioner

## 2023-09-13 VITALS — BP 138/88 | HR 61 | Temp 98.1°F | Resp 16 | Ht 66.0 in | Wt 218.4 lb

## 2023-09-13 DIAGNOSIS — G40909 Epilepsy, unspecified, not intractable, without status epilepticus: Secondary | ICD-10-CM

## 2023-09-13 DIAGNOSIS — I10 Essential (primary) hypertension: Secondary | ICD-10-CM | POA: Diagnosis not present

## 2023-09-13 DIAGNOSIS — Z0000 Encounter for general adult medical examination without abnormal findings: Secondary | ICD-10-CM | POA: Diagnosis not present

## 2023-09-13 DIAGNOSIS — E782 Mixed hyperlipidemia: Secondary | ICD-10-CM

## 2023-09-13 LAB — PSA (REFLEX TO FREE) (SERIAL): Prostate Specific Ag, Serum: 0.3 ng/mL (ref 0.0–4.0)

## 2023-09-13 MED ORDER — AMLODIPINE BESYLATE 5 MG PO TABS: 5.0000 mg | ORAL_TABLET | Freq: Every day | ORAL | 3 refills | Status: AC

## 2023-09-13 MED ORDER — ATORVASTATIN CALCIUM 10 MG PO TABS: 10.0000 mg | ORAL_TABLET | Freq: Every day | ORAL | 3 refills | Status: AC

## 2023-09-13 MED ORDER — CARBAMAZEPINE 100 MG PO CHEW
CHEWABLE_TABLET | ORAL | 3 refills | Status: DC
Start: 2023-09-13 — End: 2024-01-12

## 2023-09-13 NOTE — Progress Notes (Signed)
 [facility] [address] [place], [place] [address]  Internal MEDICINE  Office Visit Note  Patient Name: Tony Collier  [id number]  [id number]  Date of Service: 09/13/2023  Chief Complaint  Patient presents with   Hypertension   Medicare Wellness    HPI Tony Collier presents for an annual well visit and physical exam.  Well-appearing 48 y.o. male with  seizure disorder, hypertension, and high cholesterol.  Routine CRC screening: due in 2032 for colonoscopy  Labs: lab resutls discussed with patient today  New or worsening pain: none  Other concerns: none      09/13/2023   10:00 AM 09/10/2022    9:14 AM 08/12/2021   10:10 AM  MMSE - Mini Mental State Exam  Orientation to time 5 5 5   Orientation to Place 5 5 5   Registration 3 3 3   Attention/ Calculation 5 5 5   Recall 3 3 3   Language- name 2 objects 2 2 2   Language- repeat 1 1 1   Language- follow 3 step command 3 3 3   Language- read & follow direction 1 1 1   Write a sentence 1 1 1   Copy design 1 1 1   Total score 30 30 30     Functional Status Survey: Is the patient deaf or have difficulty hearing?: No Does the patient have difficulty seeing, even when wearing glasses/contacts?: No Does the patient have difficulty concentrating, remembering, or making decisions?: Yes Does the patient have difficulty walking or climbing stairs?: No Does the patient have difficulty dressing or bathing?: No Does the patient have difficulty doing errands alone such as visiting a doctor's office or shopping?: No     11/12/2021    1:22 PM 11/23/2021    2:07 PM 05/06/2022    9:34 AM 09/10/2022    9:13 AM 09/13/2023    9:59 AM  Fall Risk  Falls in the past year? 0 0 0 0 0  Was there an injury with Fall?  0 0 0 0  Fall Risk Category Calculator  0 0 0 0  Fall Risk Category (Retired)  Low      (RETIRED) Patient Fall Risk Level  Low fall risk      Patient at Risk for Falls Due to  No Fall Risks No Fall Risks No Fall Risks No Fall Risks   Fall risk Follow up  Falls evaluation completed  Falls evaluation completed Falls evaluation completed Falls evaluation completed     Data saved with a previous flowsheet row definition       09/13/2023    9:59 AM  Depression screen PHQ 2/9  Decreased Interest 0  Down, Depressed, Hopeless 0  PHQ - 2 Score 0        Current Medication: Outpatient Encounter Medications as of 09/13/2023  Medication Sig   albuterol  (VENTOLIN  HFA) 108 (90 Base) MCG/ACT inhaler Inhale 2 puffs into the lungs every 6 (six) hours as needed for wheezing or shortness of breath.   amLODipine  (NORVASC ) 5 MG tablet Take 1 tablet (5 mg total) by mouth daily. for blood pressure   atorvastatin  (LIPITOR) 10 MG tablet Take 1 tablet (10 mg total) by mouth daily.   carbamazepine  (TEGRETOL ) 100 MG chewable tablet CHEW AND SWALLOW 4 TABLETS BY MOUTH EVERY MORNING AND EVERY EVENING   fluticasone  (FLONASE ) 50 MCG/ACT nasal spray Place 2 sprays into both nostrils daily.   hydrochlorothiazide  (HYDRODIURIL ) 25 MG tablet Take 1 tablet (25 mg total) by mouth daily.   sildenafil  (VIAGRA ) 100 MG  tablet Take 1 tablet (100 mg total) by mouth daily as needed for erectile dysfunction.   [DISCONTINUED] amLODipine  (NORVASC ) 5 MG tablet TAKE 1 TABLET DAILY FOR BLOOD PRESSURE   [DISCONTINUED] atorvastatin  (LIPITOR) 10 MG tablet TAKE 1 TABLET DAILY   [DISCONTINUED] carbamazepine  (TEGRETOL ) 100 MG chewable tablet CHEW AND SWALLOW 4 TABLETS BY MOUTH EVERY MORNING AND EVERY EVENING   No facility-administered encounter medications on file as of 09/13/2023.    Surgical History: Past Surgical History:  Procedure Laterality Date   COLONOSCOPY N/A 09/12/2020   Procedure: COLONOSCOPY;  Surgeon: Janalyn Keene NOVAK, MD;  Location: [facility] ENDOSCOPY;  Service: Endoscopy;  Laterality: N/A;   FOOT SURGERY      Medical History: Past Medical History:  Diagnosis Date   Chest pain    Dizziness    Hypertension    Insomnia    Ptosis, right eyelid     Seizures (HCC)    Seizures (HCC)    Vertigo     Family History: Family History  Problem Relation Age of Onset   Hypertension Mother    Heart attack Maternal Grandfather     Social History   Socioeconomic History   Marital status: Married    Spouse name: Not on file   Number of children: 4   Years of education: Not on file   Highest education level: Not on file  Occupational History   Occupation: [employer] part time  Tobacco Use   Smoking status: Former    Current packs/day: 0.00    Average packs/day: 1 pack/day for 25.0 years (25.0 ttl pk-yrs)    Types: Cigarettes    Start date: 12/08/1992    Quit date: 12/08/2017    Years since quitting: 5.7   Smokeless tobacco: Never  Vaping Use   Vaping status: Never Used  Substance and Sexual Activity   Alcohol use: Not Currently    Comment: ocassional   Drug use: Never   Sexual activity: Not on file  Other Topics Concern   Not on file  Social History Narrative   Not on file   Social Drivers of Health   Financial Resource Strain: Not on file  Food Insecurity: Not on file  Transportation Needs: Not on file  Physical Activity: Not on file  Stress: Not on file  Social Connections: Unknown (07/28/2021)   [facility] from [facility]   Social Network    Social Network: Not on file  Intimate Partner Violence: Unknown (06/18/2021)   Received from [facility]   HITS    Physically Hurt: Not on file    Insult or Talk Down To: Not on file    Threaten Physical Harm: Not on file    Scream or Curse: Not on file      Review of Systems  Constitutional:  Negative for activity change, appetite change, chills, fatigue, fever and unexpected weight change.  HENT: Negative.  Negative for congestion, ear pain, rhinorrhea, sore throat and trouble swallowing.   Eyes: Negative.   Respiratory:  Positive for cough. Negative for chest tightness, shortness of breath and wheezing.   Cardiovascular: Negative.  Negative for chest pain and  palpitations.  Gastrointestinal: Negative.  Negative for abdominal pain, blood in stool, constipation, diarrhea, nausea and vomiting.  Endocrine: Negative.   Genitourinary: Negative.  Negative for difficulty urinating, dysuria, frequency, hematuria and urgency.  Musculoskeletal: Negative.  Negative for arthralgias, back pain, joint swelling, myalgias and neck pain.  Skin: Negative.  Negative for rash and wound.  Allergic/Immunologic: Negative.  Negative for immunocompromised state.  Neurological: Negative.  Negative for dizziness, seizures, numbness and headaches.  Hematological: Negative.   Psychiatric/Behavioral: Negative.  Negative for behavioral problems, self-injury and suicidal ideas. The patient is not nervous/anxious.     Vital Signs: BP 138/88   Pulse 61   Temp 98.1 F (36.7 C)   Resp 16   Ht 5' 6 (1.676 m)   Wt 218 lb 6.4 oz (99.1 kg)   SpO2 98%   BMI 35.25 kg/m    Physical Exam Vitals reviewed.  Constitutional:      General: He is awake. He is not in acute distress.    Appearance: Normal appearance. He is well-developed and well-groomed. He is obese. He is not ill-appearing or diaphoretic.  HENT:     Head: Normocephalic and atraumatic.     Right Ear: Tympanic membrane, ear canal and external ear normal.     Left Ear: Tympanic membrane, ear canal and external ear normal.     Nose: Nose normal. No congestion or rhinorrhea.     Mouth/Throat:     Lips: Pink.     Mouth: Mucous membranes are moist.     Pharynx: Oropharynx is clear. Uvula midline. No oropharyngeal exudate or posterior oropharyngeal erythema.   Eyes:     General: Lids are normal. Vision grossly intact. Gaze aligned appropriately. No scleral icterus.       Right eye: No discharge.        Left eye: No discharge.     Extraocular Movements: Extraocular movements intact.     Conjunctiva/sclera: Conjunctivae normal.     Pupils: Pupils are equal, round, and reactive to light.     Funduscopic exam:     Right eye: Red reflex present.        Left eye: Red reflex present.  Neck:     Thyroid : No thyromegaly.     Vascular: No JVD.     Trachea: Trachea and phonation normal. No tracheal deviation.   Cardiovascular:     Rate and Rhythm: Normal rate and regular rhythm.     Pulses: Normal pulses.     Heart sounds: Normal heart sounds, S1 normal and S2 normal. No murmur heard.    No friction rub. No gallop.  Pulmonary:     Effort: Pulmonary effort is normal. No accessory muscle usage or respiratory distress.     Breath sounds: Normal breath sounds and air entry. No stridor. No wheezing or rales.  Chest:     Chest wall: No tenderness.  Abdominal:     General: Bowel sounds are normal. There is no distension.     Palpations: Abdomen is soft. There is no shifting dullness, fluid wave, mass or pulsatile mass.     Tenderness: There is no abdominal tenderness. There is no guarding or rebound.   Musculoskeletal:        General: No tenderness or deformity. Normal range of motion.     Cervical back: Normal range of motion and neck supple.     Right lower leg: No edema.     Left lower leg: No edema.  Lymphadenopathy:     Cervical: No cervical adenopathy.   Skin:    General: Skin is warm and dry.     Capillary Refill: Capillary refill takes less than 2 seconds.     Coloration: Skin is not pale.     Findings: No erythema or rash.   Neurological:     Mental Status: He is alert and oriented to  person, place, and time.     Cranial Nerves: No cranial nerve deficit.     Motor: No abnormal muscle tone.     Coordination: Coordination normal.     Deep Tendon Reflexes: Reflexes are normal and symmetric.   Psychiatric:        Behavior: Behavior normal. Behavior is cooperative.        Thought Content: Thought content normal.        Judgment: Judgment normal.        Assessment/Plan: 1. Encounter for subsequent annual wellness visit (AWV) in Medicare patient (Primary) Age-appropriate preventive  screenings and vaccinations discussed. Routine labs for health maintenance results reviewed with patient today. PHM updated.    2. Essential hypertension Stable, continue amlodipine  as prescribed.  - amLODipine  (NORVASC ) 5 MG tablet; Take 1 tablet (5 mg total) by mouth daily. for blood pressure  Dispense: 90 tablet; Refill: 3  3. Mixed hyperlipidemia Continue atorvastatin  as prescribed.  - atorvastatin  (LIPITOR) 10 MG tablet; Take 1 tablet (10 mg total) by mouth daily.  Dispense: 90 tablet; Refill: 3  4. Seizure disorder (HCC) Continue carbamazepine  as prescribed.  - carbamazepine  (TEGRETOL ) 100 MG chewable tablet; CHEW AND SWALLOW 4 TABLETS BY MOUTH EVERY MORNING AND EVERY EVENING  Dispense: 720 tablet; Refill: 3      General Counseling: Tony Collier verbalizes understanding of the findings of todays visit and agrees with plan of treatment. I have discussed any further diagnostic evaluation that may be needed or ordered today. We also reviewed his medications today. he has been encouraged to call the office with any questions or concerns that should arise related to todays visit.    No orders of the defined types were placed in this encounter.   Meds ordered this encounter  Medications   carbamazepine  (TEGRETOL ) 100 MG chewable tablet    Sig: CHEW AND SWALLOW 4 TABLETS BY MOUTH EVERY MORNING AND EVERY EVENING    Dispense:  720 tablet    Refill:  3    For future refills   amLODipine  (NORVASC ) 5 MG tablet    Sig: Take 1 tablet (5 mg total) by mouth daily. for blood pressure    Dispense:  90 tablet    Refill:  3   atorvastatin  (LIPITOR) 10 MG tablet    Sig: Take 1 tablet (10 mg total) by mouth daily.    Dispense:  90 tablet    Refill:  3    Return in about 6 months (around 03/15/2024) for F/U, Myriam Brandhorst PCP.   Total time spent:30 Minutes Time spent includes review of chart, medications, test results, and follow up plan with the patient.   [place] Controlled Substance Database was  reviewed by me.  This patient was seen by Mardy Maxin, FNP-C in collaboration with Dr. Sigrid Bathe as a part of collaborative care agreement.  Ian Cavey R. Maxin, MSN, FNP-C Internal medicine

## 2023-09-16 ENCOUNTER — Encounter: Payer: Self-pay | Admitting: Nurse Practitioner

## 2023-10-01 ENCOUNTER — Other Ambulatory Visit: Payer: Self-pay | Admitting: Nurse Practitioner

## 2023-10-01 DIAGNOSIS — G40909 Epilepsy, unspecified, not intractable, without status epilepticus: Secondary | ICD-10-CM

## 2023-10-03 ENCOUNTER — Ambulatory Visit
Admission: RE | Admit: 2023-10-03 | Discharge: 2023-10-03 | Disposition: A | Discharge status: Home / Self Care | Attending: Physician Assistant | Admitting: Physician Assistant

## 2023-10-03 ENCOUNTER — Encounter: Payer: Self-pay | Admitting: Physician Assistant

## 2023-10-03 ENCOUNTER — Ambulatory Visit (INDEPENDENT_AMBULATORY_CARE_PROVIDER_SITE_OTHER): Admitting: Physician Assistant

## 2023-10-03 ENCOUNTER — Ambulatory Visit
Admission: RE | Admit: 2023-10-03 | Discharge: 2023-10-03 | Disposition: A | Discharge status: Home / Self Care | Source: Ambulatory Visit | Attending: Physician Assistant | Admitting: Physician Assistant

## 2023-10-03 VITALS — BP 148/88 | HR 60 | Temp 98.1°F | Resp 16 | Ht 66.0 in | Wt 214.6 lb

## 2023-10-03 DIAGNOSIS — M47812 Spondylosis without myelopathy or radiculopathy, cervical region: Secondary | ICD-10-CM | POA: Diagnosis not present

## 2023-10-03 DIAGNOSIS — S161XXA Strain of muscle, fascia and tendon at neck level, initial encounter: Secondary | ICD-10-CM | POA: Insufficient documentation

## 2023-10-03 DIAGNOSIS — M542 Cervicalgia: Secondary | ICD-10-CM

## 2023-10-03 DIAGNOSIS — I10 Essential (primary) hypertension: Secondary | ICD-10-CM | POA: Diagnosis not present

## 2023-10-03 DIAGNOSIS — M4802 Spinal stenosis, cervical region: Secondary | ICD-10-CM | POA: Diagnosis not present

## 2023-10-03 MED ORDER — CYCLOBENZAPRINE HCL 10 MG PO TABS: 10.0000 mg | ORAL_TABLET | Freq: Three times a day (TID) | ORAL | 1 refills | Status: DC | PRN

## 2023-10-03 MED ORDER — MELOXICAM 15 MG PO TABS: 15.0000 mg | ORAL_TABLET | Freq: Every day | ORAL | 0 refills | Status: DC

## 2023-10-03 NOTE — Addendum Note (Signed)
 Addended by: Kyrielle Urbanski on: 10/03/2023 02:23 PM   Modules accepted: Orders

## 2023-10-03 NOTE — Progress Notes (Addendum)
 [facility] [address] [place], [place] [address]  Internal MEDICINE  Office Visit Note  Patient Name: Tony Collier  [id number]  [id number]  Date of Service: 10/03/2023  Chief Complaint  Patient presents with   Acute Visit    Neck pain x couple weeks     HPI Pt is here for a sick visit. -Neck pain for a few weeks, but worse over the weekend -right side, can't turn to right very much, a little better to the left -feels like a knot that wont release -does not recall injuring it -took some advil  Current Medication:  Outpatient Encounter Medications as of 10/03/2023  Medication Sig   albuterol  (VENTOLIN  HFA) 108 (90 Base) MCG/ACT inhaler Inhale 2 puffs into the lungs every 6 (six) hours as needed for wheezing or shortness of breath.   amLODipine  (NORVASC ) 5 MG tablet Take 1 tablet (5 mg total) by mouth daily. for blood pressure   atorvastatin  (LIPITOR) 10 MG tablet Take 1 tablet (10 mg total) by mouth daily.   carbamazepine  (TEGRETOL ) 100 MG chewable tablet CHEW AND SWALLOW 4 TABLETS BY MOUTH EVERY MORNING AND EVERY EVENING   fluticasone  (FLONASE ) 50 MCG/ACT nasal spray Place 2 sprays into both nostrils daily.   hydrochlorothiazide  (HYDRODIURIL ) 25 MG tablet Take 1 tablet (25 mg total) by mouth daily.   sildenafil  (VIAGRA ) 100 MG tablet Take 1 tablet (100 mg total) by mouth daily as needed for erectile dysfunction.   No facility-administered encounter medications on file as of 10/03/2023.      Medical History: Past Medical History:  Diagnosis Date   Chest pain    Dizziness    Hypertension    Insomnia    Ptosis, right eyelid    Seizures (HCC)    Seizures (HCC)    Vertigo      Vital Signs: BP (!) 148/88   Pulse 60   Temp 98.1 F (36.7 C)   Resp 16   Ht 5' 6 (1.676 m)   Wt 214 lb 9.6 oz (97.3 kg)   SpO2 98%   BMI 34.64 kg/m    Review of Systems  Constitutional:  Negative for chills, fatigue and fever.  HENT:  Negative for congestion,  mouth sores and postnasal drip.   Respiratory:  Negative for cough.   Cardiovascular:  Negative for chest pain.  Genitourinary:  Negative for flank pain.  Musculoskeletal:  Positive for myalgias, neck pain and neck stiffness.  Skin:  Negative for rash.  Psychiatric/Behavioral: Negative.      Physical Exam Vitals and nursing note reviewed.  Constitutional:      General: He is not in acute distress.    Appearance: Normal appearance. He is obese. He is not ill-appearing.  HENT:     Head: Normocephalic and atraumatic.  Eyes:     Extraocular Movements: Extraocular movements intact.  Neck:     Comments: Tender to palpation along right side of neck posteriorly. No tenderness overlying spine and no deformity. Slight tenderness on left side as well. Significantly reduced ROM turning right and slightly reduced left as well Cardiovascular:     Rate and Rhythm: Normal rate and regular rhythm.  Pulmonary:     Effort: Pulmonary effort is normal. No respiratory distress.  Musculoskeletal:     Cervical back: Tenderness present.  Neurological:     Mental Status: He is alert and oriented to person, place, and time.  Psychiatric:        Mood and Affect: Mood normal.  Behavior: Behavior normal.       Assessment/Plan: 1. Strain of neck muscle, initial encounter (Primary) Will start mobic  and advised not to combine with other NSAIDs. May also use flexeril  up to TID--cautioned on grogginess. Also cautioned on driving if unable to turn neck. Advised to go to [facility] if not improving. Pt called back after appt and would like to move forward with xray - cyclobenzaprine  (FLEXERIL ) 10 MG tablet; Take 1 tablet (10 mg total) by mouth 3 (three) times daily as needed for muscle spasms. Take one tab po qhs for back spasm prn only  Dispense: 30 tablet; Refill: 1 - meloxicam  (MOBIC ) 15 MG tablet; Take 1 tablet (15 mg total) by mouth daily.  Dispense: 30 tablet; Refill: 0  2. Acute neck pain Will  order xray and treat as above  3. Essential hypertension Elevated in office, likely due to pain and will monitor   General Counseling: Carlin oakland understanding of the findings of todays visit and agrees with plan of treatment. I have discussed any further diagnostic evaluation that may be needed or ordered today. We also reviewed his medications today. he has been encouraged to call the office with any questions or concerns that should arise related to todays visit.    Counseling:    No orders of the defined types were placed in this encounter.   No orders of the defined types were placed in this encounter.   Time spent:25 Minutes

## 2023-10-04 ENCOUNTER — Ambulatory Visit: Payer: Self-pay | Admitting: Physician Assistant

## 2023-10-04 NOTE — Telephone Encounter (Signed)
Spoke with patient regarding x-ray results. 

## 2023-10-04 NOTE — Telephone Encounter (Signed)
-----   Message from Tinnie MARLA Pro sent at 10/04/2023  3:40 PM EDT ----- Please let him know that there are no acute abnormalities on his xray, but does show some chronic degenerative changes ----- Message ----- From: Interface, Rad Results In Sent: 10/03/2023   3:32 PM EDT To: Tinnie MARLA Pro, PA-C

## 2023-10-07 DIAGNOSIS — M47812 Spondylosis without myelopathy or radiculopathy, cervical region: Secondary | ICD-10-CM | POA: Diagnosis not present

## 2023-10-18 ENCOUNTER — Telehealth: Payer: Self-pay

## 2023-10-18 DIAGNOSIS — N529 Male erectile dysfunction, unspecified: Secondary | ICD-10-CM

## 2023-10-18 MED ORDER — SILDENAFIL CITRATE 100 MG PO TABS: 100.0000 mg | ORAL_TABLET | Freq: Every day | ORAL | 2 refills | Status: DC | PRN

## 2023-10-18 NOTE — Telephone Encounter (Signed)
Try to call pt no answer 

## 2023-10-30 ENCOUNTER — Other Ambulatory Visit: Payer: Self-pay | Admitting: Physician Assistant

## 2023-10-30 DIAGNOSIS — S161XXA Strain of muscle, fascia and tendon at neck level, initial encounter: Secondary | ICD-10-CM

## 2023-11-02 DIAGNOSIS — M47812 Spondylosis without myelopathy or radiculopathy, cervical region: Secondary | ICD-10-CM | POA: Diagnosis not present

## 2023-11-02 DIAGNOSIS — M5412 Radiculopathy, cervical region: Secondary | ICD-10-CM | POA: Diagnosis not present

## 2023-11-02 DIAGNOSIS — M542 Cervicalgia: Secondary | ICD-10-CM | POA: Diagnosis not present

## 2023-12-20 NOTE — Progress Notes (Signed)
 Tony Collier                                          MRN: [id number]   12/20/2023   The [facility] Quality Team Specialist reviewed this patient medical record for the purposes of chart review for care gap closure. The following were reviewed: chart review for care gap closure-controlling blood pressure.    [facility] Quality Team

## 2024-01-12 ENCOUNTER — Other Ambulatory Visit: Payer: Self-pay

## 2024-01-12 DIAGNOSIS — G40909 Epilepsy, unspecified, not intractable, without status epilepticus: Secondary | ICD-10-CM

## 2024-01-12 MED ORDER — CARBAMAZEPINE 100 MG PO CHEW: CHEWABLE_TABLET | ORAL | 3 refills | Status: AC

## 2024-01-30 ENCOUNTER — Ambulatory Visit (INDEPENDENT_AMBULATORY_CARE_PROVIDER_SITE_OTHER): Admitting: Nurse Practitioner

## 2024-01-30 ENCOUNTER — Encounter: Payer: Self-pay | Admitting: Nurse Practitioner

## 2024-01-30 VITALS — BP 138/88 | HR 71 | Temp 97.4°F | Resp 16 | Ht 66.0 in | Wt 213.4 lb

## 2024-01-30 DIAGNOSIS — Z87898 Personal history of other specified conditions: Secondary | ICD-10-CM | POA: Diagnosis not present

## 2024-01-30 DIAGNOSIS — R42 Dizziness and giddiness: Secondary | ICD-10-CM | POA: Diagnosis not present

## 2024-01-30 DIAGNOSIS — M542 Cervicalgia: Secondary | ICD-10-CM | POA: Diagnosis not present

## 2024-01-30 DIAGNOSIS — G40909 Epilepsy, unspecified, not intractable, without status epilepticus: Secondary | ICD-10-CM | POA: Diagnosis not present

## 2024-01-30 DIAGNOSIS — G8929 Other chronic pain: Secondary | ICD-10-CM

## 2024-01-30 MED ORDER — MECLIZINE HCL 25 MG PO TABS: 25.0000 mg | ORAL_TABLET | Freq: Three times a day (TID) | ORAL | 1 refills | Status: DC | PRN

## 2024-01-30 MED ORDER — MELOXICAM 15 MG PO TABS: 15.0000 mg | ORAL_TABLET | Freq: Every day | ORAL | 1 refills | Status: DC

## 2024-01-30 NOTE — Progress Notes (Signed)
 [facility] [address] [place], [place] [address]  Internal MEDICINE  Office Visit Note  Patient Name: Tony Collier  [id number]  [id number]  Date of Service: 01/30/2024  Chief Complaint  Patient presents with   Acute Visit    Dizzy-Vertigo     HPI Tony Collier presents for an acute sick visit for dizziness --onset of dizziness was yesterday after getting up from a dental chair. He did not have vertigo but did have dizziness and he fell twice . Has had multiple episodes of dizziness without vertigo after the initial episode yesterday and today but has not had anymore falls Denies headache, nausea, vomiting.  He sees neurology for his seizures and Dr. Maree also address the dizziness when he was experiencing this in October of 2023. His last visit with Dr. Maree was in September 2024. Patient is overdue for routine follow up with neurology as well.  He currently does not have any more meclizine .      Current Medication:  Outpatient Encounter Medications as of 01/30/2024  Medication Sig   meclizine  (ANTIVERT ) 25 MG tablet Take 1 tablet (25 mg total) by mouth 3 (three) times daily as needed for dizziness.   albuterol  (VENTOLIN  HFA) 108 (90 Base) MCG/ACT inhaler Inhale 2 puffs into the lungs every 6 (six) hours as needed for wheezing or shortness of breath.   amLODipine  (NORVASC ) 5 MG tablet Take 1 tablet (5 mg total) by mouth daily. for blood pressure   atorvastatin  (LIPITOR) 10 MG tablet Take 1 tablet (10 mg total) by mouth daily.   carbamazepine  (TEGRETOL ) 100 MG chewable tablet CHEW AND SWALLOW 4 TABLETS BY MOUTH EVERY MORNING AND EVERY EVENING   cyclobenzaprine  (FLEXERIL ) 10 MG tablet Take 1 tablet (10 mg total) by mouth 3 (three) times daily as needed for muscle spasms. Take one tab po qhs for back spasm prn only   fluticasone  (FLONASE ) 50 MCG/ACT nasal spray Place 2 sprays into both nostrils daily.   hydrochlorothiazide  (HYDRODIURIL ) 25 MG tablet Take 1 tablet (25  mg total) by mouth daily.   meloxicam  (MOBIC ) 15 MG tablet Take 1 tablet (15 mg total) by mouth daily.   sildenafil  (VIAGRA ) 100 MG tablet Take 1 tablet (100 mg total) by mouth daily as needed for erectile dysfunction.   [DISCONTINUED] meloxicam  (MOBIC ) 15 MG tablet TAKE 1 TABLET(15 MG) BY MOUTH DAILY   No facility-administered encounter medications on file as of 01/30/2024.      Medical History: Past Medical History:  Diagnosis Date   Chest pain    Dizziness    Hypertension    Insomnia    Ptosis, right eyelid    Seizures (HCC)    Seizures (HCC)    Vertigo      Vital Signs: BP 138/88   Pulse 71   Temp (!) 97.4 F (36.3 C)   Resp 16   Ht 5' 6 (1.676 m)   Wt 213 lb 6.4 oz (96.8 kg)   SpO2 97%   BMI 34.44 kg/m    Review of Systems  Constitutional:  Positive for fatigue. Negative for appetite change, chills and fever.  HENT:  Negative for congestion, postnasal drip and sinus pressure.   Respiratory:  Negative for cough, chest tightness, shortness of breath and wheezing.   Cardiovascular: Negative.  Negative for chest pain and palpitations.  Musculoskeletal:  Positive for arthralgias, gait problem and neck pain. Negative for myalgias.  Neurological:  Positive for dizziness and seizures. Negative for light-headedness, numbness and headaches.  Physical Exam Vitals reviewed.  Constitutional:      General: He is not in acute distress.    Appearance: Normal appearance. He is obese. He is not ill-appearing.  HENT:     Head: Normocephalic and atraumatic.     Right Ear: Tympanic membrane, ear canal and external ear normal.     Left Ear: Tympanic membrane, ear canal and external ear normal.     Nose: Nose normal. No congestion or rhinorrhea.     Mouth/Throat:     Mouth: Mucous membranes are moist.     Pharynx: Oropharynx is clear. No oropharyngeal exudate or posterior oropharyngeal erythema.  Eyes:     Pupils: Pupils are equal, round, and reactive to light.   Cardiovascular:     Rate and Rhythm: Normal rate and regular rhythm.  Pulmonary:     Effort: Pulmonary effort is normal. No respiratory distress.  Neurological:     Mental Status: He is alert.  Psychiatric:        Mood and Affect: Mood normal.        Behavior: Behavior normal.       Assessment/Plan: 1. Dizziness (Primary) Meclizine  prescribed as needed  - meclizine  (ANTIVERT ) 25 MG tablet; Take 1 tablet (25 mg total) by mouth 3 (three) times daily as needed for dizziness.  Dispense: 90 tablet; Refill: 1  2. Seizure disorder (HCC) Continue tegretol  as prescribed. Reminded patient to call neurologist office to follow up  3. Neck pain, chronic Continue meloxicam  as prescribed.  - meloxicam  (MOBIC ) 15 MG tablet; Take 1 tablet (15 mg total) by mouth daily.  Dispense: 90 tablet; Refill: 1  4. History of vertigo Meclizine  prescribed as needed.  - meclizine  (ANTIVERT ) 25 MG tablet; Take 1 tablet (25 mg total) by mouth 3 (three) times daily as needed for dizziness.  Dispense: 90 tablet; Refill: 1   General Counseling: Tony Collier verbalizes understanding of the findings of todays visit and agrees with plan of treatment. I have discussed any further diagnostic evaluation that may be needed or ordered today. We also reviewed his medications today. he has been encouraged to call the office with any questions or concerns that should arise related to todays visit.    Counseling:    No orders of the defined types were placed in this encounter.   Meds ordered this encounter  Medications   meclizine  (ANTIVERT ) 25 MG tablet    Sig: Take 1 tablet (25 mg total) by mouth 3 (three) times daily as needed for dizziness.    Dispense:  90 tablet    Refill:  1    Fill new script   meloxicam  (MOBIC ) 15 MG tablet    Sig: Take 1 tablet (15 mg total) by mouth daily.    Dispense:  90 tablet    Refill:  1    Return if symptoms worsen or fail to improve, for patient to call his neurologist for  follow up visit.  [place] Controlled Substance Database was reviewed by me for overdose risk score (ORS)  Time spent:30 Minutes Time spent with patient included reviewing progress notes, labs, imaging studies, and discussing plan for follow up.   This patient was seen by Mardy Maxin, FNP-C in collaboration with Dr. Sigrid Bathe as a part of collaborative care agreement.  Faline Langer R. Maxin, MSN, FNP-C Internal Medicine

## 2024-03-19 ENCOUNTER — Ambulatory Visit: Payer: Self-pay | Admitting: Nurse Practitioner

## 2024-03-19 ENCOUNTER — Encounter: Payer: Self-pay | Admitting: Nurse Practitioner

## 2024-03-19 VITALS — BP 136/88 | HR 68 | Temp 96.3°F | Resp 16 | Ht 66.0 in | Wt 217.2 lb

## 2024-03-19 DIAGNOSIS — I10 Essential (primary) hypertension: Secondary | ICD-10-CM | POA: Diagnosis not present

## 2024-03-19 DIAGNOSIS — Z87898 Personal history of other specified conditions: Secondary | ICD-10-CM | POA: Diagnosis not present

## 2024-03-19 DIAGNOSIS — Z6835 Body mass index (BMI) 35.0-35.9, adult: Secondary | ICD-10-CM | POA: Diagnosis not present

## 2024-03-19 DIAGNOSIS — M542 Cervicalgia: Secondary | ICD-10-CM | POA: Diagnosis not present

## 2024-03-19 DIAGNOSIS — G8929 Other chronic pain: Secondary | ICD-10-CM | POA: Diagnosis not present

## 2024-03-19 DIAGNOSIS — G40909 Epilepsy, unspecified, not intractable, without status epilepticus: Secondary | ICD-10-CM

## 2024-03-19 DIAGNOSIS — E538 Deficiency of other specified B group vitamins: Secondary | ICD-10-CM

## 2024-03-19 DIAGNOSIS — N529 Male erectile dysfunction, unspecified: Secondary | ICD-10-CM | POA: Diagnosis not present

## 2024-03-19 DIAGNOSIS — R7301 Impaired fasting glucose: Secondary | ICD-10-CM

## 2024-03-19 DIAGNOSIS — E66812 Obesity, class 2: Secondary | ICD-10-CM | POA: Diagnosis not present

## 2024-03-19 DIAGNOSIS — R42 Dizziness and giddiness: Secondary | ICD-10-CM

## 2024-03-19 DIAGNOSIS — J0110 Acute frontal sinusitis, unspecified: Secondary | ICD-10-CM

## 2024-03-19 DIAGNOSIS — E559 Vitamin D deficiency, unspecified: Secondary | ICD-10-CM | POA: Diagnosis not present

## 2024-03-19 DIAGNOSIS — E782 Mixed hyperlipidemia: Secondary | ICD-10-CM | POA: Diagnosis not present

## 2024-03-19 DIAGNOSIS — Z79899 Other long term (current) drug therapy: Secondary | ICD-10-CM | POA: Diagnosis not present

## 2024-03-19 DIAGNOSIS — Z125 Encounter for screening for malignant neoplasm of prostate: Secondary | ICD-10-CM | POA: Diagnosis not present

## 2024-03-19 MED ORDER — MECLIZINE HCL 25 MG PO TABS: 25.0000 mg | ORAL_TABLET | Freq: Three times a day (TID) | ORAL | 1 refills | Status: AC | PRN

## 2024-03-19 MED ORDER — ALBUTEROL SULFATE HFA 108 (90 BASE) MCG/ACT IN AERS: 2.0000 | INHALATION_SPRAY | Freq: Four times a day (QID) | RESPIRATORY_TRACT | 0 refills | Status: DC | PRN

## 2024-03-19 MED ORDER — FLUTICASONE PROPIONATE 50 MCG/ACT NA SUSP: 2.0000 | Freq: Every day | NASAL | 6 refills | Status: AC

## 2024-03-19 MED ORDER — SILDENAFIL CITRATE 100 MG PO TABS: 100.0000 mg | ORAL_TABLET | Freq: Every day | ORAL | 2 refills | Status: AC | PRN

## 2024-03-19 MED ORDER — MELOXICAM 15 MG PO TABS: 15.0000 mg | ORAL_TABLET | Freq: Every day | ORAL | 1 refills | Status: AC

## 2024-03-19 MED ORDER — HYDROCHLOROTHIAZIDE 25 MG PO TABS: 25.0000 mg | ORAL_TABLET | Freq: Every day | ORAL | 3 refills | Status: AC

## 2024-03-19 MED ORDER — CYCLOBENZAPRINE HCL 10 MG PO TABS: 10.0000 mg | ORAL_TABLET | Freq: Three times a day (TID) | ORAL | 1 refills | Status: AC | PRN

## 2024-03-19 MED ORDER — AZITHROMYCIN 250 MG PO TABS: ORAL_TABLET | ORAL | 0 refills | Status: AC

## 2024-03-19 NOTE — Progress Notes (Signed)
 [facility] [address] [place], [place] [address]  Internal MEDICINE  Office Visit Note  Patient Name: Tony Collier  [id number]  [id number]  Date of Service: 03/19/2024  Chief Complaint  Patient presents with   Hypertension   Follow-up    HPI [place] presents for a follow-up visit for seizures, neck pain, hypertension, high cholesterol, obesity and lab orders.  Seizures -- controlled on carbamazapine.  Chronic neck pain -- takes meloxicam  as needed  Hypertension -- controlled on current medications  High cholesterol -- takes atorvastatin  daily  Obesity -- wants to try wegovy or zepbound. The medication will not be covered by medicare until July though.  Due for routine labs in June     Current Medication: Outpatient Encounter Medications as of 03/19/2024  Medication Sig   azithromycin  (ZITHROMAX ) 250 MG tablet Take 2 tablets on day 1, then 1 tablet daily on days 2 through 5   albuterol  (VENTOLIN  HFA) 108 (90 Base) MCG/ACT inhaler Inhale 2 puffs into the lungs every 6 (six) hours as needed for wheezing or shortness of breath.   amLODipine  (NORVASC ) 5 MG tablet Take 1 tablet (5 mg total) by mouth daily. for blood pressure   atorvastatin  (LIPITOR) 10 MG tablet Take 1 tablet (10 mg total) by mouth daily.   carbamazepine  (TEGRETOL ) 100 MG chewable tablet CHEW AND SWALLOW 4 TABLETS BY MOUTH EVERY MORNING AND EVERY EVENING   cyclobenzaprine  (FLEXERIL ) 10 MG tablet Take 1 tablet (10 mg total) by mouth 3 (three) times daily as needed for muscle spasms. Take one tab po qhs for back spasm prn only   fluticasone  (FLONASE ) 50 MCG/ACT nasal spray Place 2 sprays into both nostrils daily.   hydrochlorothiazide  (HYDRODIURIL ) 25 MG tablet Take 1 tablet (25 mg total) by mouth daily.   meclizine  (ANTIVERT ) 25 MG tablet Take 1 tablet (25 mg total) by mouth 3 (three) times daily as needed for dizziness.   meloxicam  (MOBIC ) 15 MG tablet Take 1 tablet (15 mg total) by mouth daily.    sildenafil  (VIAGRA ) 100 MG tablet Take 1 tablet (100 mg total) by mouth daily as needed for erectile dysfunction.   [DISCONTINUED] albuterol  (VENTOLIN  HFA) 108 (90 Base) MCG/ACT inhaler Inhale 2 puffs into the lungs every 6 (six) hours as needed for wheezing or shortness of breath.   [DISCONTINUED] cyclobenzaprine  (FLEXERIL ) 10 MG tablet Take 1 tablet (10 mg total) by mouth 3 (three) times daily as needed for muscle spasms. Take one tab po qhs for back spasm prn only (Patient not taking: Reported on 03/19/2024)   [DISCONTINUED] fluticasone  (FLONASE ) 50 MCG/ACT nasal spray Place 2 sprays into both nostrils daily.   [DISCONTINUED] hydrochlorothiazide  (HYDRODIURIL ) 25 MG tablet Take 1 tablet (25 mg total) by mouth daily.   [DISCONTINUED] meclizine  (ANTIVERT ) 25 MG tablet Take 1 tablet (25 mg total) by mouth 3 (three) times daily as needed for dizziness.   [DISCONTINUED] meloxicam  (MOBIC ) 15 MG tablet Take 1 tablet (15 mg total) by mouth daily. (Patient not taking: Reported on 03/19/2024)   [DISCONTINUED] sildenafil  (VIAGRA ) 100 MG tablet Take 1 tablet (100 mg total) by mouth daily as needed for erectile dysfunction.   No facility-administered encounter medications on file as of 03/19/2024.    Surgical History: Past Surgical History:  Procedure Laterality Date   COLONOSCOPY N/A 09/12/2020   Procedure: COLONOSCOPY;  Surgeon: Janalyn Keene NOVAK, MD;  Location: [facility] ENDOSCOPY;  Service: Endoscopy;  Laterality: N/A;   FOOT SURGERY      Medical History: Past  Medical History:  Diagnosis Date   Chest pain    Dizziness    Hypertension    Insomnia    Ptosis, right eyelid    Seizures (HCC)    Seizures (HCC)    Vertigo     Family History: Family History  Problem Relation Age of Onset   Hypertension Mother    Heart attack Maternal Grandfather     Social History   Socioeconomic History   Marital status: Married    Spouse name: Not on file   Number of children: 4   Years of education: Not on  file   Highest education level: Not on file  Occupational History   Occupation: [employer] part time  Tobacco Use   Smoking status: Former    Current packs/day: 0.00    Average packs/day: 1 pack/day for 25.0 years (25.0 ttl pk-yrs)    Types: Cigarettes    Start date: 12/08/1992    Quit date: 12/08/2017    Years since quitting: 6.2   Smokeless tobacco: Never  Vaping Use   Vaping status: Never Used  Substance and Sexual Activity   Alcohol use: Not Currently    Comment: ocassional   Drug use: Never   Sexual activity: Not on file  Other Topics Concern   Not on file  Social History Narrative   Not on file   Social Drivers of Health   Tobacco Use: Medium Risk (03/19/2024)   Patient History    Smoking Tobacco Use: Former    Smokeless Tobacco Use: Never    Passive Exposure: Not on [place] Strain: Not on file  Food Insecurity: Not on file  Transportation Needs: Not on file  Physical Activity: Not on file  Stress: Not on file  Social Connections: Unknown (07/28/2021)   [facility]   Social Network    Social Network: Not on file  Intimate Partner Violence: Unknown (06/18/2021)   Received from [facility]   HITS    Physically Hurt: Not on file    Insult or Talk Down To: Not on file    Threaten Physical Harm: Not on file    Scream or Curse: Not on file  Depression (PHQ2-9): Low Risk (09/13/2023)   Depression (PHQ2-9)    PHQ-2 Score: 0  Alcohol Screen: Low Risk (11/23/2021)   Alcohol Screen    Last Alcohol Screening Score (AUDIT): 0  Housing: Not on file  Utilities: Not on file  Health Literacy: Not on file      Review of Systems  Constitutional:  Positive for fatigue. Negative for appetite change, chills and fever.  HENT:  Negative for congestion, postnasal drip and sinus pressure.   Respiratory:  Negative for cough, chest tightness, shortness of breath and wheezing.   Cardiovascular: Negative.  Negative for chest pain and palpitations.   Musculoskeletal:  Positive for arthralgias, gait problem and neck pain. Negative for myalgias.  Neurological:  Positive for dizziness and seizures. Negative for light-headedness, numbness and headaches.    Vital Signs: BP 136/88   Pulse 68   Temp (!) 96.3 F (35.7 C)   Resp 16   Ht 5' 6 (1.676 m)   Wt 217 lb 3.2 oz (98.5 kg)   SpO2 97%   BMI 35.06 kg/m    Physical Exam Vitals reviewed.  Constitutional:      General: He is not in acute distress.    Appearance: Normal appearance. He is obese. He is not ill-appearing.  HENT:  Head: Normocephalic and atraumatic.     Right Ear: Tympanic membrane, ear canal and external ear normal.     Left Ear: Tympanic membrane, ear canal and external ear normal.     Nose: Nose normal. No congestion or rhinorrhea.     Mouth/Throat:     Mouth: Mucous membranes are moist.     Pharynx: Oropharynx is clear. No oropharyngeal exudate or posterior oropharyngeal erythema.  Eyes:     Pupils: Pupils are equal, round, and reactive to light.  Cardiovascular:     Rate and Rhythm: Normal rate and regular rhythm.  Pulmonary:     Effort: Pulmonary effort is normal. No respiratory distress.  Neurological:     Mental Status: He is alert.  Psychiatric:        Mood and Affect: Mood normal.        Behavior: Behavior normal.        Assessment/Plan: 1. Acute non-recurrent frontal sinusitis (Primary) Zpak prescribed, take until gone. Continue prn albuterol  inhaler as prescribed.  - albuterol  (VENTOLIN  HFA) 108 (90 Base) MCG/ACT inhaler; Inhale 2 puffs into the lungs every 6 (six) hours as needed for wheezing or shortness of breath.  Dispense: 8 g; Refill: 0 - azithromycin  (ZITHROMAX ) 250 MG tablet; Take 2 tablets on day 1, then 1 tablet daily on days 2 through 5  Dispense: 6 tablet; Refill: 0  2. Essential hypertension Stable, continue medications as prescribed. Routine labs ordered  - hydrochlorothiazide  (HYDRODIURIL ) 25 MG tablet; Take 1 tablet (25  mg total) by mouth daily.  Dispense: 90 tablet; Refill: 3 - CBC with Differential/Platelet - CMP14+EGFR - Lipid Profile  3. Mixed hyperlipidemia Routine labs ordered  - CBC with Differential/Platelet - CMP14+EGFR - Lipid Profile - Hgb A1C w/o eAG  4. Impaired fasting glucose Routine lab ordered  - Hgb A1C w/o eAG  5. Neck pain, chronic Continue prn meloxicam  and prn cyclobenzaprine  as prescribed  - meloxicam  (MOBIC ) 15 MG tablet; Take 1 tablet (15 mg total) by mouth daily.  Dispense: 90 tablet; Refill: 1 - cyclobenzaprine  (FLEXERIL ) 10 MG tablet; Take 1 tablet (10 mg total) by mouth 3 (three) times daily as needed for muscle spasms. Take one tab po qhs for back spasm prn only  Dispense: 30 tablet; Refill: 1  6. Dizziness Continue prn meclizine  as prescribed  - meclizine  (ANTIVERT ) 25 MG tablet; Take 1 tablet (25 mg total) by mouth 3 (three) times daily as needed for dizziness.  Dispense: 90 tablet; Refill: 1  7. B12 deficiency Routine labs ordered  - CBC with Differential/Platelet - B12 and Folate Panel  8. Vitamin D  deficiency Routine lab ordered  - Vitamin D  (25 hydroxy)  9. Class 2 severe obesity due to excess calories with serious comorbidity and body mass index (BMI) of 35.0 to 35.9 in adult Will discuss trying wegovy in July   10. On carbamazepine  therapy Routine lab ordered  - Carbamazepine  level, total; Future  11. Screening for prostate cancer Routine lab ordered  - PSA Total (Reflex To Free)  12. Encounter for medication review Medication list reviewed, updated and refills ordered  - albuterol  (VENTOLIN  HFA) 108 (90 Base) MCG/ACT inhaler; Inhale 2 puffs into the lungs every 6 (six) hours as needed for wheezing or shortness of breath.  Dispense: 8 g; Refill: 0 - sildenafil  (VIAGRA ) 100 MG tablet; Take 1 tablet (100 mg total) by mouth daily as needed for erectile dysfunction.  Dispense: 30 tablet; Refill: 2 - fluticasone  (FLONASE ) 50 MCG/ACT nasal spray; Place  2 sprays into both nostrils daily.  Dispense: 16 g; Refill: 6   General Counseling: Martinez verbalizes understanding of the findings of todays visit and agrees with plan of treatment. I have discussed any further diagnostic evaluation that may be needed or ordered today. We also reviewed his medications today. he has been encouraged to call the office with any questions or concerns that should arise related to todays visit.    Orders Placed This Encounter  Procedures   CBC with Differential/Platelet   CMP14+EGFR   Lipid Profile   PSA Total (Reflex To Free)   Carbamazepine  level, total   Hgb A1C w/o eAG   Vitamin D  (25 hydroxy)   B12 and Folate Panel    Meds ordered this encounter  Medications   meloxicam  (MOBIC ) 15 MG tablet    Sig: Take 1 tablet (15 mg total) by mouth daily.    Dispense:  90 tablet    Refill:  1   cyclobenzaprine  (FLEXERIL ) 10 MG tablet    Sig: Take 1 tablet (10 mg total) by mouth 3 (three) times daily as needed for muscle spasms. Take one tab po qhs for back spasm prn only    Dispense:  30 tablet    Refill:  1   hydrochlorothiazide  (HYDRODIURIL ) 25 MG tablet    Sig: Take 1 tablet (25 mg total) by mouth daily.    Dispense:  90 tablet    Refill:  3   albuterol  (VENTOLIN  HFA) 108 (90 Base) MCG/ACT inhaler    Sig: Inhale 2 puffs into the lungs every 6 (six) hours as needed for wheezing or shortness of breath.    Dispense:  8 g    Refill:  0   sildenafil  (VIAGRA ) 100 MG tablet    Sig: Take 1 tablet (100 mg total) by mouth daily as needed for erectile dysfunction.    Dispense:  30 tablet    Refill:  2   meclizine  (ANTIVERT ) 25 MG tablet    Sig: Take 1 tablet (25 mg total) by mouth 3 (three) times daily as needed for dizziness.    Dispense:  90 tablet    Refill:  1    Fill new script   fluticasone  (FLONASE ) 50 MCG/ACT nasal spray    Sig: Place 2 sprays into both nostrils daily.    Dispense:  16 g    Refill:  6   azithromycin  (ZITHROMAX ) 250 MG tablet     Sig: Take 2 tablets on day 1, then 1 tablet daily on days 2 through 5    Dispense:  6 tablet    Refill:  0    Fill new script today    Return for previously scheduled, AWV, Pessy Delamar PCP.   Total time spent:30 Minutes Time spent includes review of chart, medications, test results, and follow up plan with the patient.   [place] Controlled Substance Database was reviewed by me.  This patient was seen by Mardy Maxin, FNP-C in collaboration with Dr. Sigrid Bathe as a part of collaborative care agreement.   Felicity Penix R. Maxin, MSN, FNP-C Internal medicine

## 2024-04-10 ENCOUNTER — Other Ambulatory Visit: Payer: Self-pay | Admitting: Nurse Practitioner

## 2024-04-10 DIAGNOSIS — Z79899 Other long term (current) drug therapy: Secondary | ICD-10-CM

## 2024-04-10 DIAGNOSIS — J0110 Acute frontal sinusitis, unspecified: Secondary | ICD-10-CM

## 2024-09-13 ENCOUNTER — Ambulatory Visit: Admitting: Nurse Practitioner
